# Patient Record
Sex: Male | Born: 1960
Health system: Southern US, Community
[De-identification: ages and names within clinical notes are randomized; demographics above are authoritative.]

## PROBLEM LIST (undated history)

## (undated) DIAGNOSIS — I10 Essential (primary) hypertension: Secondary | ICD-10-CM

## (undated) DIAGNOSIS — K610 Anal abscess: Secondary | ICD-10-CM

## (undated) DIAGNOSIS — Z8489 Family history of other specified conditions: Secondary | ICD-10-CM

## (undated) DIAGNOSIS — T883XXA Malignant hyperthermia due to anesthesia, initial encounter: Secondary | ICD-10-CM

---
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
## 2009-09-07 HISTORY — PX: CHOLECYSTECTOMY: SHX55

## 2010-06-10 ENCOUNTER — Inpatient Hospital Stay: Payer: Self-pay | Admitting: Surgery

## 2011-05-24 IMAGING — CT CT ABD-PELV W/ CM
1 of 2 series · 16 of 32 positions shown, 20 images · IV contrast (isovue)
Comparison: none

REASON FOR EXAM: (1) Pancreatitis; (2) same
COMMENTS:

PROCEDURE:     CT  - CT ABDOMEN / PELVIS  W  - June 10, 2010  [DATE]
RESULT:     Comparison:  Abdominal ultrasound 06/10/2010
TECHNIQUE: Multiple axial images of the abdomen and pelvis were performed
from the lung bases to the pubic symphysis, with p.o. contrast and with 100
mL of Isovue 370 intravenous contrast.

[Series 2: abd/pel · axial · 0.80mm/px · z∈[-1122,-612]mm · 16 of 186 slices shown, 20 images]
[im 8/186  soft-tissue]
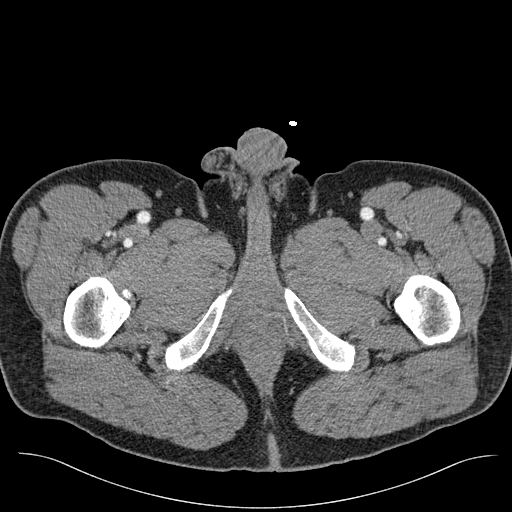
[im 8/186  bone]
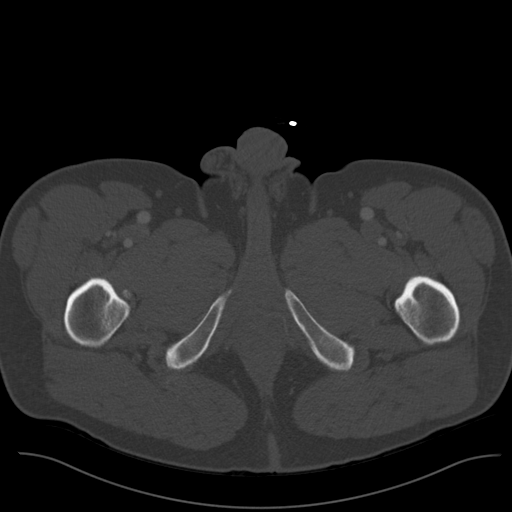
[im 24/186  soft-tissue]
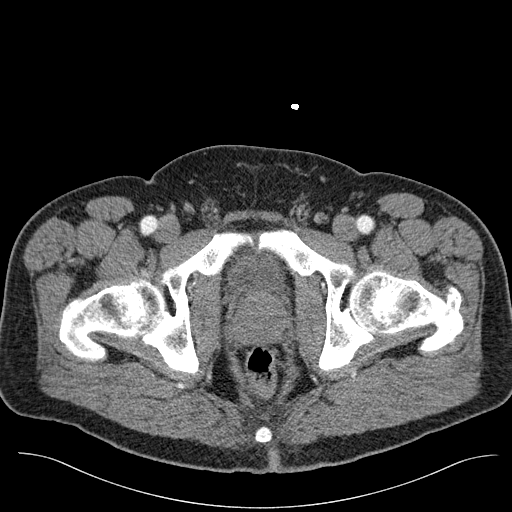
[im 39/186  soft-tissue]
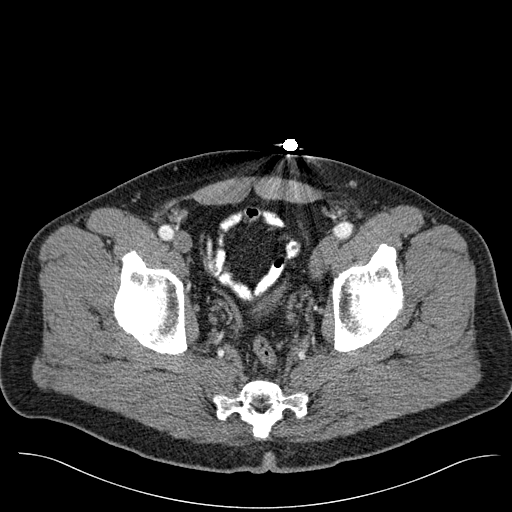
[im 47/186  soft-tissue]
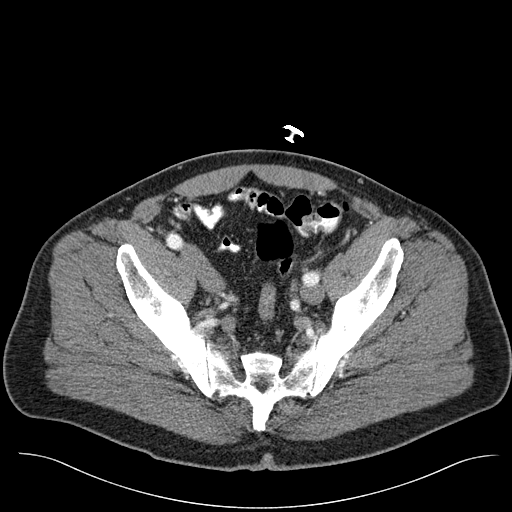
[im 62/186  soft-tissue]
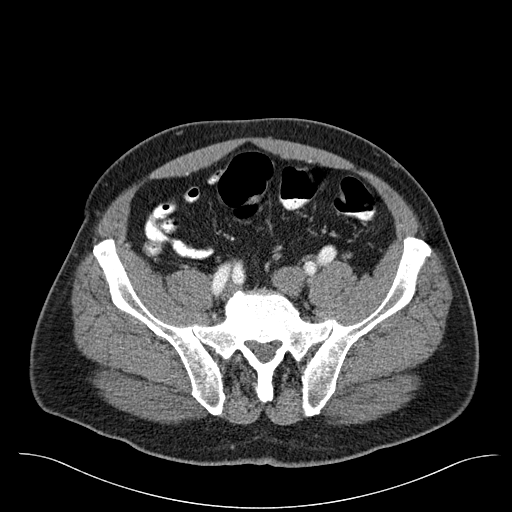
[im 78/186  soft-tissue]
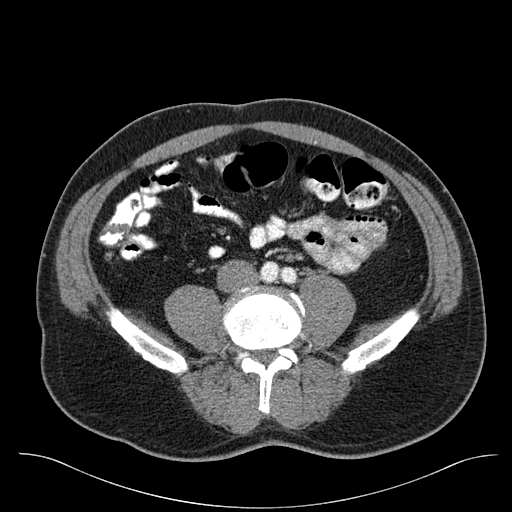
[im 85/186  soft-tissue]
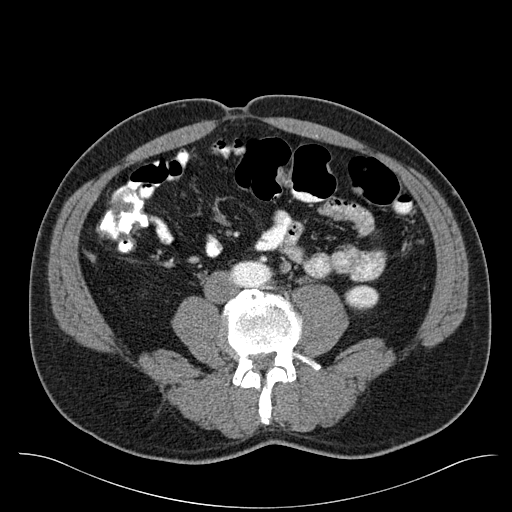
[im 101/186  soft-tissue]
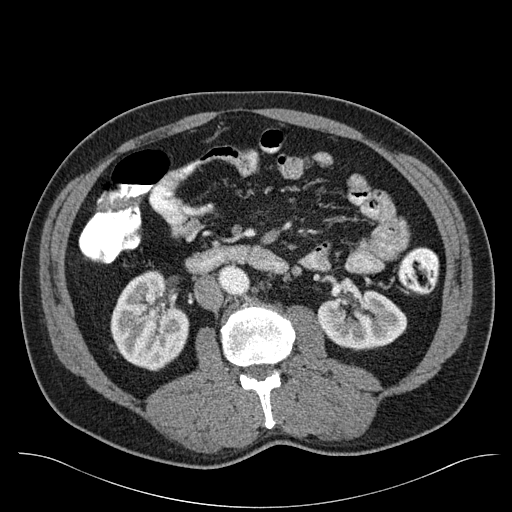
[im 108/186  soft-tissue]
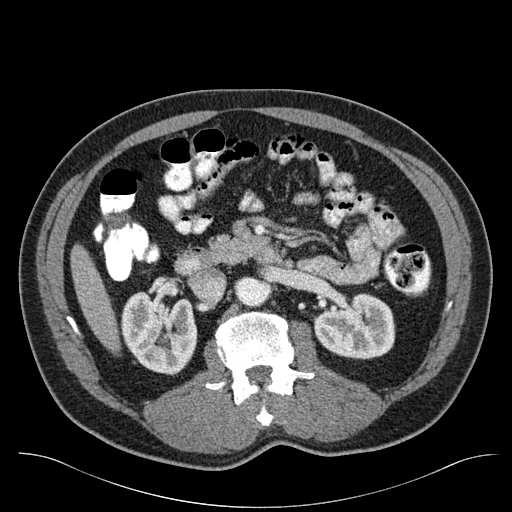
[im 108/186  bone]
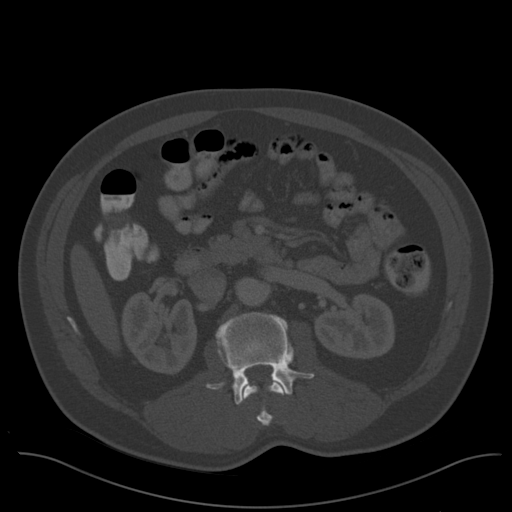
[im 124/186  soft-tissue]
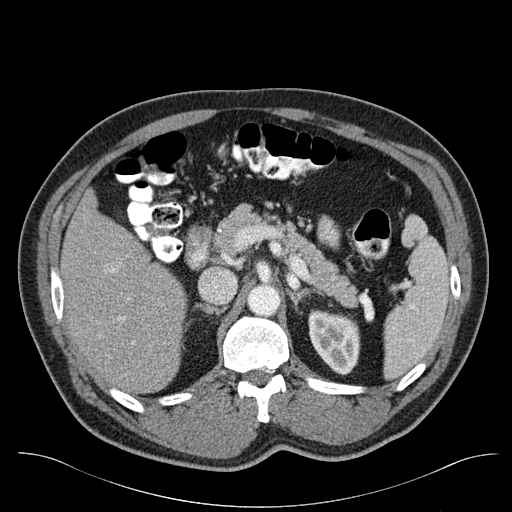
[im 139/186  soft-tissue]
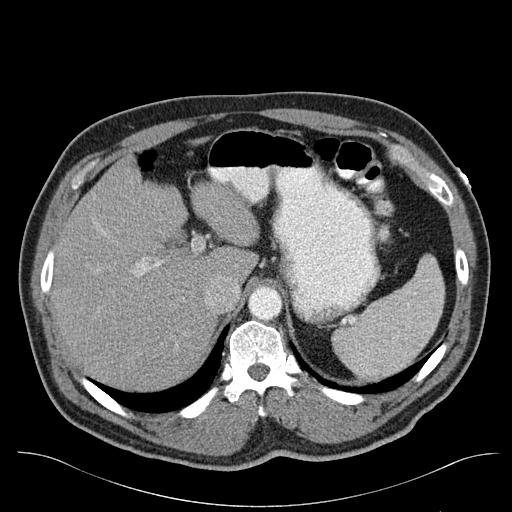
[im 147/186  soft-tissue]
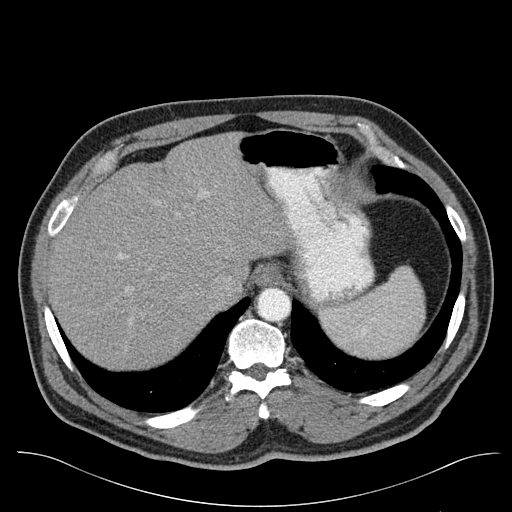
[im 155/186  lung]
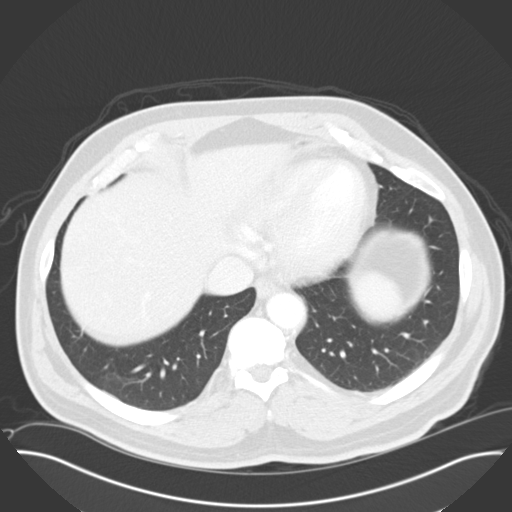
[im 162/186  soft-tissue]
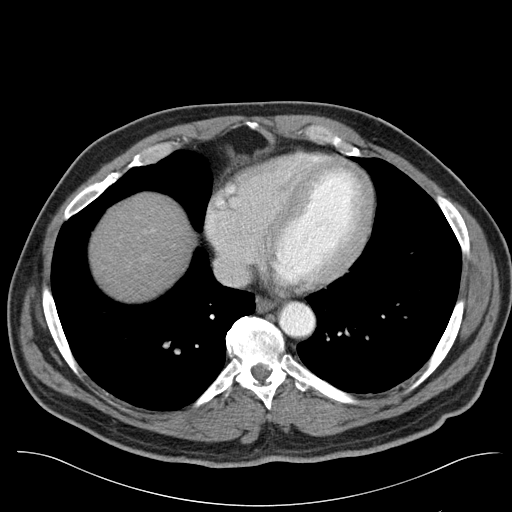
[im 162/186  lung]
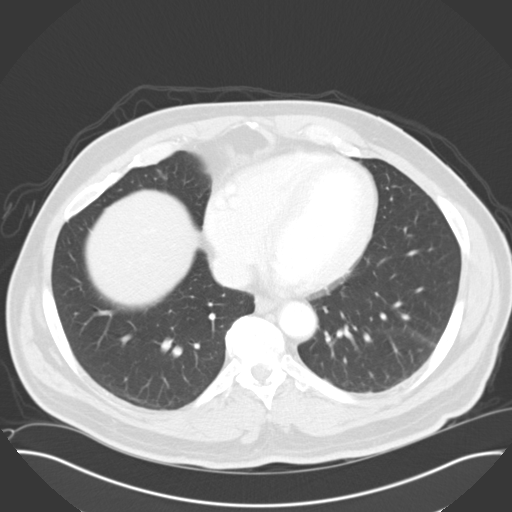
[im 170/186  lung]
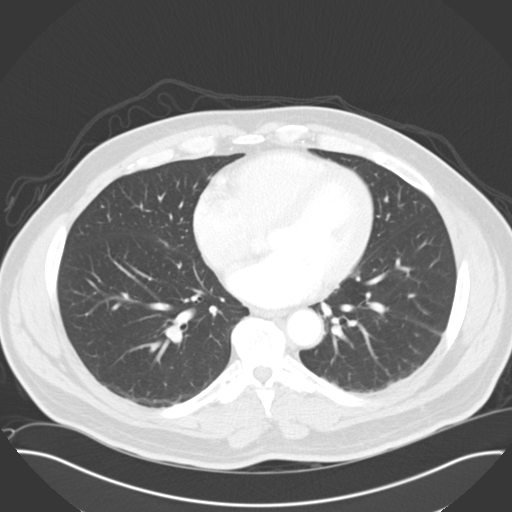
[im 178/186  soft-tissue]
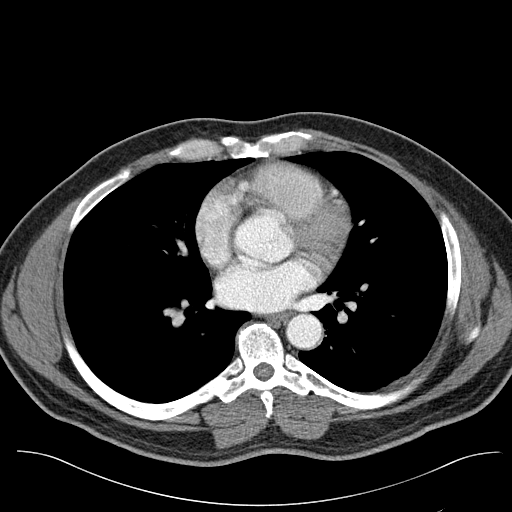
[im 178/186  lung]
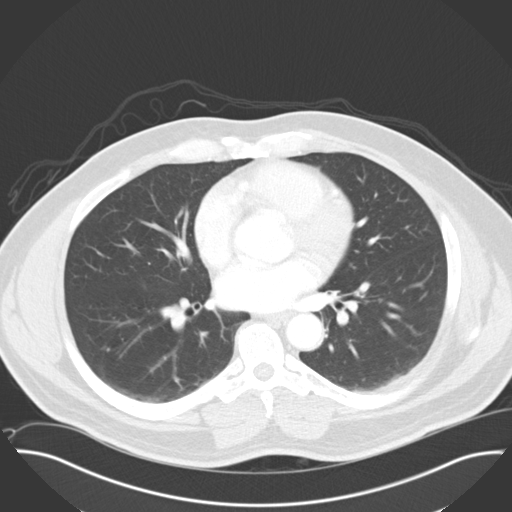

[16 of 32 positions shown; findings below may reference images not displayed]

FINDINGS: No focal liver lesion identified. There are multiple stones within the
gallbladder. The gallbladder wall appears minimally prominent. However, is
not thickened on the gallbladder ultrasound performed same day. The spleen,
adrenals, and pancreas are unremarkable. The kidneys enhance normally.

No retroperitoneal mesenteric lymphadenopathy. The small and large bowel are
normal in caliber. The appendix is normal.

No aggressive lytic or sclerotic osseous lesions identified.
IMPRESSION: 1. No CT evidence of acute pancreatitis.
2. Cholelithiasis.

## 2021-01-11 ENCOUNTER — Other Ambulatory Visit: Payer: Self-pay

## 2021-01-11 ENCOUNTER — Emergency Department
Admission: EM | Admit: 2021-01-11 | Discharge: 2021-01-11 | Disposition: A | Discharge status: Home / Self Care | Payer: BC Managed Care – PPO | Source: Home / Self Care | Attending: Emergency Medicine | Admitting: Emergency Medicine

## 2021-01-11 DIAGNOSIS — L0231 Cutaneous abscess of buttock: Secondary | ICD-10-CM | POA: Diagnosis not present

## 2021-01-11 DIAGNOSIS — E876 Hypokalemia: Secondary | ICD-10-CM

## 2021-01-11 DIAGNOSIS — I10 Essential (primary) hypertension: Secondary | ICD-10-CM | POA: Insufficient documentation

## 2021-01-11 DIAGNOSIS — M25421 Effusion, right elbow: Secondary | ICD-10-CM

## 2021-01-11 DIAGNOSIS — Y9389 Activity, other specified: Secondary | ICD-10-CM | POA: Insufficient documentation

## 2021-01-11 DIAGNOSIS — L03317 Cellulitis of buttock: Secondary | ICD-10-CM | POA: Insufficient documentation

## 2021-01-11 DIAGNOSIS — M7022 Olecranon bursitis, left elbow: Secondary | ICD-10-CM | POA: Insufficient documentation

## 2021-01-11 HISTORY — DX: Essential (primary) hypertension: I10

## 2021-01-11 LAB — CBC WITH DIFFERENTIAL/PLATELET
Abs Immature Granulocytes: 0.09 10*3/uL — ABNORMAL HIGH (ref 0.00–0.07)
Basophils Absolute: 0.1 10*3/uL (ref 0.0–0.1)
Basophils Relative: 1 %
Eosinophils Absolute: 0.2 10*3/uL (ref 0.0–0.5)
Eosinophils Relative: 2 %
HCT: 37.3 % — ABNORMAL LOW (ref 39.0–52.0)
Hemoglobin: 13 g/dL (ref 13.0–17.0)
Immature Granulocytes: 1 %
Lymphocytes Relative: 9 %
Lymphs Abs: 0.9 10*3/uL (ref 0.7–4.0)
MCH: 28.8 pg (ref 26.0–34.0)
MCHC: 34.9 g/dL (ref 30.0–36.0)
MCV: 82.5 fL (ref 80.0–100.0)
Monocytes Absolute: 1.3 10*3/uL — ABNORMAL HIGH (ref 0.1–1.0)
Monocytes Relative: 12 %
Neutro Abs: 8.3 10*3/uL — ABNORMAL HIGH (ref 1.7–7.7)
Neutrophils Relative %: 75 %
Platelets: 192 10*3/uL (ref 150–400)
RBC: 4.52 MIL/uL (ref 4.22–5.81)
RDW: 13.5 % (ref 11.5–15.5)
WBC: 10.9 10*3/uL — ABNORMAL HIGH (ref 4.0–10.5)
nRBC: 0 % (ref 0.0–0.2)

## 2021-01-11 LAB — BASIC METABOLIC PANEL
Anion gap: 11 (ref 5–15)
BUN: 17 mg/dL (ref 6–20)
CO2: 28 mmol/L (ref 22–32)
Calcium: 8.7 mg/dL — ABNORMAL LOW (ref 8.9–10.3)
Chloride: 94 mmol/L — ABNORMAL LOW (ref 98–111)
Creatinine, Ser: 1.11 mg/dL (ref 0.61–1.24)
GFR, Estimated: >60 mL/min (ref >60)
Glucose, Bld: 111 mg/dL — ABNORMAL HIGH (ref 70–99)
Potassium: 2.7 mmol/L — CL (ref 3.5–5.1)
Sodium: 133 mmol/L — ABNORMAL LOW (ref 135–145)

## 2021-01-11 MED ORDER — HYDROMORPHONE HCL 1 MG/ML IJ SOLN
1.0000 mg | Freq: Once | INTRAMUSCULAR | Status: AC
  Administered 2021-01-11: 1 mg via INTRAVENOUS
  Filled 2021-01-11: qty 1

## 2021-01-11 MED ORDER — CLINDAMYCIN PHOSPHATE 600 MG/50ML IV SOLN
600.0000 mg | Freq: Once | INTRAVENOUS | Status: AC
  Administered 2021-01-11: 600 mg via INTRAVENOUS
  Filled 2021-01-11: qty 50

## 2021-01-11 MED ORDER — POTASSIUM CHLORIDE CRYS ER 10 MEQ PO TBCR: 10.0000 meq | EXTENDED_RELEASE_TABLET | Freq: Two times a day (BID) | ORAL | 0 refills | Status: DC

## 2021-01-11 MED ORDER — CLINDAMYCIN HCL 150 MG PO CAPS: 150.0000 mg | ORAL_CAPSULE | Freq: Four times a day (QID) | ORAL | 0 refills | Status: DC

## 2021-01-11 MED ORDER — KETOROLAC TROMETHAMINE 30 MG/ML IJ SOLN
30.0000 mg | Freq: Once | INTRAMUSCULAR | Status: AC
  Administered 2021-01-11: 30 mg via INTRAVENOUS
  Filled 2021-01-11: qty 1

## 2021-01-11 MED ORDER — KETOROLAC TROMETHAMINE 10 MG PO TABS: 10.0000 mg | ORAL_TABLET | Freq: Four times a day (QID) | ORAL | 0 refills | Status: DC | PRN

## 2021-01-11 MED ORDER — MORPHINE SULFATE (PF) 4 MG/ML IV SOLN: 4.0000 mg | Freq: Once | INTRAVENOUS | Status: DC

## 2021-01-11 MED ORDER — OXYCODONE-ACETAMINOPHEN 7.5-325 MG PO TABS: 1.0000 | ORAL_TABLET | Freq: Four times a day (QID) | ORAL | 0 refills | Status: AC | PRN

## 2021-01-11 MED ORDER — LIDOCAINE-EPINEPHRINE 2 %-1:100000 IJ SOLN
20.0000 mL | Freq: Once | INTRAMUSCULAR | Status: AC
  Administered 2021-01-11: 20 mL
  Filled 2021-01-11: qty 1

## 2021-01-11 NOTE — ED Notes (Signed)
See triage note  Presents with possible cyst to tailbone area  Noticed area last Saturday  But area is worse today

## 2021-01-11 NOTE — ED Triage Notes (Signed)
Pt states about a week ago he noticed an abscess on his left buttock- pt states it has grown in size since then

## 2021-01-11 NOTE — Discharge Instructions (Addendum)
The abscess on your buttocks is non fluctuant.  Incision and drainage is not indicated at this time.  You received IV antibiotics in the ED today.  You will be switched to oral antibiotics, anti-inflammatories, and pain medications upon discharge.  Advised to call the surgical clinic listed on your discharge care instruction to schedule appointment for further evaluation and treatment.  Advised not to eat breakfast on the morning that you call in case they need you to come in for definitive treatment.  Your labs showed that your potassium level was low.  You were given a prescription for potassium to take twice a day until evaluation by your family doctor.  Be advised continue antibiotics until seen by surgical clinic.  Pain medication may cause drowsiness.  Do not drive operate machinery while taking this medication.

## 2021-01-11 NOTE — ED Provider Notes (Signed)
[facility] Emergency Department Provider Note   ____________________________________________   None    (approximate)  I have reviewed the triage vital signs and the nursing notes.   HISTORY  Chief Complaint Abscess    HPI Daniel Steele is a 60 y.o. male presents for an abscess on the left buttocks which has grown in size the past week.  Patient denies he has it mostly on right buttocks.  Patient also has swelling to the posterior right elbow.  Patient states edema to left elbow for approximately 2 weeks.  Denies fever associated complaint.  Rates his pain as a 7/10.  Described pain as "sore".  No palliative measures for complaint.      Past Medical History:  Diagnosis Date  . Hypertension     There are no problems to display for this patient.   Past Surgical History:  Procedure Laterality Date  . CHOLECYSTECTOMY      Prior to Admission medications   Medication Sig Start Date End Date Taking? Authorizing Provider  clindamycin (CLEOCIN) 150 MG capsule Take 1 capsule (150 mg total) by mouth 4 (four) times daily for 10 days. 01/11/21 01/21/21 Yes Joni Reining, PA-C  ketorolac (TORADOL) 10 MG tablet Take 1 tablet (10 mg total) by mouth every 6 (six) hours as needed. 01/11/21  Yes Joni Reining, PA-C  oxyCODONE-acetaminophen (PERCOCET) 7.5-325 MG tablet Take 1 tablet by mouth every 6 (six) hours as needed for up to 5 days for severe pain. 01/11/21 01/16/21 Yes Joni Reining, PA-C  potassium chloride (KLOR-CON) 10 MEQ tablet Take 1 tablet (10 mEq total) by mouth 2 (two) times daily. 01/11/21  Yes Joni Reining, PA-C    Allergies Patient has no known allergies.  No family history on file.  Social History Social History   Tobacco Use  . Smoking status: Never Smoker  . Smokeless tobacco: Never Used    Review of Systems Constitutional: No fever/chills Eyes: No visual changes. ENT: No sore throat. Cardiovascular: Denies chest  pain. Respiratory: Denies shortness of breath. Gastrointestinal: No abdominal pain.  No nausea, no vomiting.  No diarrhea.  No constipation. Genitourinary: Negative for dysuria. Musculoskeletal: Negative for back pain. Skin: Negative for rash.  Edema to posterior right elbow.  Edema and erythema to the left buttocks. Neurological: Negative for headaches, focal weakness or numbness. Endocrine:  Hypertension   ____________________________________________   PHYSICAL EXAM:  VITAL SIGNS: ED Triage Vitals  Enc Vitals Group     BP 01/11/21 1244 (!) 149/96     Pulse Rate 01/11/21 1244 88     Resp 01/11/21 1244 18     Temp 01/11/21 1244 99.8 F (37.7 C)     Temp Source 01/11/21 1244 Oral     SpO2 01/11/21 1244 98 %     Weight 01/11/21 1245 220 lb (99.8 kg)     Height 01/11/21 1245 5\' 11"  (1.803 m)     Head Circumference --      Peak Flow --      Pain Score 01/11/21 1245 7     Pain Loc --      Pain Edu? --      Excl. in GC? --     Constitutional: Alert and oriented. Well appearing and in no acute distress. Cardiovascular: Normal rate, regular rhythm. Grossly normal heart sounds.  Good peripheral circulation. Respiratory: Normal respiratory effort.  No retractions. Lungs CTAB. Gastrointestinal: Soft and nontender. No distention. No abdominal bruits. No CVA tenderness.  Genitourinary: Deferred Musculoskeletal: No lower extremity tenderness nor edema.  No joint effusions. Neurologic:  Normal speech and language. No gross focal neurologic deficits are appreciated. No gait instability. Skin: Edema to right posterior elbow.  Edema and erythema to left buttocks. Psychiatric: Mood and affect are normal. Speech and behavior are normal.  ____________________________________________   LABS (all labs ordered are listed, but only abnormal results are displayed)  Labs Reviewed  BASIC METABOLIC PANEL - Abnormal; Notable for the following components:      Result Value   Sodium 133 (*)     Potassium 2.7 (*)    Chloride 94 (*)    Glucose, Bld 111 (*)    Calcium 8.7 (*)    All other components within normal limits  CBC WITH DIFFERENTIAL/PLATELET - Abnormal; Notable for the following components:   WBC 10.9 (*)    HCT 37.3 (*)    Neutro Abs 8.3 (*)    Monocytes Absolute 1.3 (*)    Abs Immature Granulocytes 0.09 (*)    All other components within normal limits   ____________________________________________  EKG  _________________________________________  RADIOLOGY I, Joni Reining, personally viewed and evaluated these images (plain radiographs) as part of my medical decision making, as well as reviewing the written report by the radiologist.  ED MD interpretation:   Official radiology report(s): No results found.  ____________________________________________   PROCEDURES  Procedure(s) performed (including Critical Care):  Procedures   ____________________________________________   INITIAL IMPRESSION / ASSESSMENT AND PLAN / ED COURSE  As part of my medical decision making, I reviewed the following data within the electronic MEDICAL RECORD NUMBER         Patient presents with swelling to the posterior left elbow for probably 2 weeks.  Patient complaining physical exam consistent with olecranon bursitis.  See procedure note for aspiration.  Patient also has edema and erythema to the left buttocks.  Area is nonfluctuant and I&D is not indicated at this time.  Due to the extensive area of edema and erythema IV antibiotics was started using clindamycin.  Patient will switch to oral medication and advised to follow-up by calling surgical clinic in 2 days.  Return back to ED if condition worsens.  Patient labs showed that he was hypokalemic and a prescription for potassium was generated.  Patient advised establish care with a PCP as soon as possible.      ____________________________________________   FINAL CLINICAL IMPRESSION(S) / ED DIAGNOSES  Final diagnoses:   Effusion of right olecranon bursa  Cellulitis and abscess of buttock  Hypokalemia     ED Discharge Orders         Ordered    clindamycin (CLEOCIN) 150 MG capsule  4 times daily        01/11/21 1634    oxyCODONE-acetaminophen (PERCOCET) 7.5-325 MG tablet  Every 6 hours PRN        01/11/21 1634    ketorolac (TORADOL) 10 MG tablet  Every 6 hours PRN        01/11/21 1634    potassium chloride (KLOR-CON) 10 MEQ tablet  2 times daily        01/11/21 1644          *Please note:  Daniel Steele was evaluated in Emergency Department on 01/11/2021 for the symptoms described in the history of present illness. He was evaluated in the context of the global COVID-19 pandemic, which necessitated consideration that the patient might be at risk for infection with the SARS-CoV-2  virus that causes COVID-19. Institutional protocols and algorithms that pertain to the evaluation of patients at risk for COVID-19 are in a state of rapid change based on information released by regulatory bodies including the CDC and federal and state organizations. These policies and algorithms were followed during the patient's care in the ED.  Some ED evaluations and interventions may be delayed as a result of limited staffing during and the pandemic.*   Note:  This document was prepared using Dragon voice recognition software and may include unintentional dictation errors.    Joni Reining, PA-C 01/11/21 1647    Sharyn Creamer, MD 01/11/21 1710

## 2021-01-13 ENCOUNTER — Inpatient Hospital Stay: Payer: BC Managed Care – PPO

## 2021-01-13 ENCOUNTER — Encounter: Payer: Self-pay | Admitting: Surgery

## 2021-01-13 ENCOUNTER — Ambulatory Visit: Payer: BC Managed Care – PPO | Admitting: Surgery

## 2021-01-13 ENCOUNTER — Inpatient Hospital Stay
Admission: AD | Admit: 2021-01-13 | Discharge: 2021-01-15 | DRG: 603 | Disposition: A | Discharge status: Home / Self Care | Payer: BC Managed Care – PPO | Attending: Surgery | Admitting: Surgery

## 2021-01-13 ENCOUNTER — Inpatient Hospital Stay: Payer: BC Managed Care – PPO | Admitting: Anesthesiology

## 2021-01-13 ENCOUNTER — Telehealth: Payer: Self-pay

## 2021-01-13 ENCOUNTER — Encounter
Admission: AD | Disposition: A | Discharge status: Home / Self Care | Payer: Self-pay | Source: Home / Self Care | Attending: Surgery

## 2021-01-13 ENCOUNTER — Other Ambulatory Visit: Payer: Self-pay

## 2021-01-13 VITALS — BP 168/99 | HR 71 | Temp 98.0°F | Ht 71.0 in | Wt 231.8 lb

## 2021-01-13 DIAGNOSIS — M160 Bilateral primary osteoarthritis of hip: Secondary | ICD-10-CM | POA: Diagnosis not present

## 2021-01-13 DIAGNOSIS — K6139 Other ischiorectal abscess: Secondary | ICD-10-CM | POA: Diagnosis present

## 2021-01-13 DIAGNOSIS — K611 Rectal abscess: Secondary | ICD-10-CM

## 2021-01-13 DIAGNOSIS — Z20822 Contact with and (suspected) exposure to covid-19: Secondary | ICD-10-CM | POA: Diagnosis present

## 2021-01-13 DIAGNOSIS — N281 Cyst of kidney, acquired: Secondary | ICD-10-CM | POA: Diagnosis not present

## 2021-01-13 DIAGNOSIS — I10 Essential (primary) hypertension: Secondary | ICD-10-CM | POA: Diagnosis present

## 2021-01-13 DIAGNOSIS — E876 Hypokalemia: Secondary | ICD-10-CM | POA: Diagnosis not present

## 2021-01-13 DIAGNOSIS — B954 Other streptococcus as the cause of diseases classified elsewhere: Secondary | ICD-10-CM

## 2021-01-13 DIAGNOSIS — K610 Anal abscess: Secondary | ICD-10-CM | POA: Diagnosis present

## 2021-01-13 DIAGNOSIS — L0231 Cutaneous abscess of buttock: Secondary | ICD-10-CM | POA: Diagnosis not present

## 2021-01-13 HISTORY — PX: INCISION AND DRAINAGE ABSCESS: SHX5864

## 2021-01-13 HISTORY — DX: Family history of other specified conditions: Z84.89

## 2021-01-13 LAB — CBC WITH DIFFERENTIAL/PLATELET
Abs Immature Granulocytes: 0.19 10*3/uL — ABNORMAL HIGH (ref 0.00–0.07)
Basophils Absolute: 0.1 10*3/uL (ref 0.0–0.1)
Basophils Relative: 1 %
Eosinophils Absolute: 0.2 10*3/uL (ref 0.0–0.5)
Eosinophils Relative: 2 %
HCT: 39 % (ref 39.0–52.0)
Hemoglobin: 13.2 g/dL (ref 13.0–17.0)
Immature Granulocytes: 1 %
Lymphocytes Relative: 7 %
Lymphs Abs: 1 10*3/uL (ref 0.7–4.0)
MCH: 28.4 pg (ref 26.0–34.0)
MCHC: 33.8 g/dL (ref 30.0–36.0)
MCV: 83.9 fL (ref 80.0–100.0)
Monocytes Absolute: 0.9 10*3/uL (ref 0.1–1.0)
Monocytes Relative: 7 %
Neutro Abs: 11 10*3/uL — ABNORMAL HIGH (ref 1.7–7.7)
Neutrophils Relative %: 82 %
Platelets: 206 10*3/uL (ref 150–400)
RBC: 4.65 MIL/uL (ref 4.22–5.81)
RDW: 13.7 % (ref 11.5–15.5)
WBC: 13.4 10*3/uL — ABNORMAL HIGH (ref 4.0–10.5)
nRBC: 0 % (ref 0.0–0.2)

## 2021-01-13 LAB — COMPREHENSIVE METABOLIC PANEL
ALT: 69 U/L — ABNORMAL HIGH (ref 0–44)
AST: 83 U/L — ABNORMAL HIGH (ref 15–41)
Albumin: 3.1 g/dL — ABNORMAL LOW (ref 3.5–5.0)
Alkaline Phosphatase: 123 U/L (ref 38–126)
Anion gap: 13 (ref 5–15)
BUN: 10 mg/dL (ref 6–20)
CO2: 27 mmol/L (ref 22–32)
Calcium: 8.7 mg/dL — ABNORMAL LOW (ref 8.9–10.3)
Chloride: 92 mmol/L — ABNORMAL LOW (ref 98–111)
Creatinine, Ser: 0.94 mg/dL (ref 0.61–1.24)
GFR, Estimated: >60 mL/min (ref >60)
Glucose, Bld: 99 mg/dL (ref 70–99)
Potassium: 3 mmol/L — ABNORMAL LOW (ref 3.5–5.1)
Sodium: 132 mmol/L — ABNORMAL LOW (ref 135–145)
Total Bilirubin: 1.2 mg/dL (ref 0.3–1.2)
Total Protein: 7.2 g/dL (ref 6.5–8.1)

## 2021-01-13 LAB — RESP PANEL BY RT-PCR (FLU A&B, COVID) ARPGX2
Influenza A by PCR: NEGATIVE
Influenza B by PCR: NEGATIVE
SARS Coronavirus 2 by RT PCR: NEGATIVE

## 2021-01-13 SURGERY — INCISION AND DRAINAGE, ABSCESS
Anesthesia: General

## 2021-01-13 MED ORDER — CHLORHEXIDINE GLUCONATE 0.12 % MT SOLN: 15.0000 mL | Freq: Once | OROMUCOSAL | Status: AC

## 2021-01-13 MED ORDER — LIDOCAINE HCL URETHRAL/MUCOSAL 2 % EX GEL
CUTANEOUS | Status: AC
  Filled 2021-01-13: qty 10

## 2021-01-13 MED ORDER — ROCURONIUM BROMIDE 100 MG/10ML IV SOLN
INTRAVENOUS | Status: DC | PRN
  Administered 2021-01-13: 50 mg via INTRAVENOUS
  Administered 2021-01-13: 30 mg via INTRAVENOUS

## 2021-01-13 MED ORDER — SODIUM CHLORIDE 0.9 % IV SOLN: 2.0000 g | INTRAVENOUS | Status: DC

## 2021-01-13 MED ORDER — FENTANYL CITRATE (PF) 100 MCG/2ML IJ SOLN
INTRAMUSCULAR | Status: AC
  Filled 2021-01-13: qty 2

## 2021-01-13 MED ORDER — LIDOCAINE HCL (CARDIAC) PF 100 MG/5ML IV SOSY
PREFILLED_SYRINGE | INTRAVENOUS | Status: DC | PRN
  Administered 2021-01-13: 100 mg via INTRAVENOUS

## 2021-01-13 MED ORDER — PROPOFOL 10 MG/ML IV BOLUS
INTRAVENOUS | Status: AC
  Filled 2021-01-13: qty 20

## 2021-01-13 MED ORDER — FENTANYL CITRATE (PF) 100 MCG/2ML IJ SOLN
INTRAMUSCULAR | Status: DC | PRN
  Administered 2021-01-13 (×2): 50 ug via INTRAVENOUS

## 2021-01-13 MED ORDER — ACETAMINOPHEN 500 MG PO TABS
1000.0000 mg | ORAL_TABLET | Freq: Four times a day (QID) | ORAL | Status: DC
  Administered 2021-01-14 – 2021-01-15 (×6): 1000 mg via ORAL
  Filled 2021-01-13 (×7): qty 2

## 2021-01-13 MED ORDER — SODIUM CHLORIDE 0.9 % IV SOLN
INTRAVENOUS | Status: AC
  Filled 2021-01-13: qty 2

## 2021-01-13 MED ORDER — GABAPENTIN 300 MG PO CAPS: 300.0000 mg | ORAL_CAPSULE | ORAL | Status: AC

## 2021-01-13 MED ORDER — OXYCODONE HCL 5 MG PO TABS: 5.0000 mg | ORAL_TABLET | ORAL | Status: DC | PRN

## 2021-01-13 MED ORDER — ONDANSETRON HCL 4 MG/2ML IJ SOLN
4.0000 mg | Freq: Once | INTRAMUSCULAR | Status: DC | PRN
Start: 2021-01-13 — End: 2021-01-13

## 2021-01-13 MED ORDER — PROPOFOL 10 MG/ML IV BOLUS
INTRAVENOUS | Status: DC | PRN
  Administered 2021-01-13: 160 mg via INTRAVENOUS
  Administered 2021-01-13: 40 mg via INTRAVENOUS

## 2021-01-13 MED ORDER — BUPIVACAINE-EPINEPHRINE (PF) 0.5% -1:200000 IJ SOLN
INTRAMUSCULAR | Status: AC
  Filled 2021-01-13: qty 30

## 2021-01-13 MED ORDER — SODIUM CHLORIDE (PF) 0.9 % IJ SOLN
INTRAMUSCULAR | Status: AC
  Filled 2021-01-13: qty 10

## 2021-01-13 MED ORDER — LACTATED RINGERS IV SOLN: 125.0000 mL/h | INTRAVENOUS | Status: DC

## 2021-01-13 MED ORDER — CHLORHEXIDINE GLUCONATE CLOTH 2 % EX PADS
6.0000 | MEDICATED_PAD | Freq: Once | CUTANEOUS | Status: DC
Start: 2021-01-13 — End: 2021-01-13

## 2021-01-13 MED ORDER — KETOROLAC TROMETHAMINE 30 MG/ML IJ SOLN
30.0000 mg | Freq: Four times a day (QID) | INTRAMUSCULAR | Status: DC
  Administered 2021-01-14 – 2021-01-15 (×4): 30 mg via INTRAVENOUS
  Filled 2021-01-13 (×6): qty 1

## 2021-01-13 MED ORDER — DEXAMETHASONE SODIUM PHOSPHATE 10 MG/ML IJ SOLN
INTRAMUSCULAR | Status: DC | PRN
  Administered 2021-01-13: 10 mg via INTRAVENOUS

## 2021-01-13 MED ORDER — SUGAMMADEX SODIUM 500 MG/5ML IV SOLN
INTRAVENOUS | Status: DC | PRN
  Administered 2021-01-13: 200 mg via INTRAVENOUS

## 2021-01-13 MED ORDER — CHLORHEXIDINE GLUCONATE 0.12 % MT SOLN
OROMUCOSAL | Status: AC
  Administered 2021-01-13: 15 mL via OROMUCOSAL
  Filled 2021-01-13: qty 15

## 2021-01-13 MED ORDER — HYDROMORPHONE HCL 1 MG/ML IJ SOLN: 0.5000 mg | INTRAMUSCULAR | Status: DC | PRN

## 2021-01-13 MED ORDER — ONDANSETRON 4 MG PO TBDP: 4.0000 mg | ORAL_TABLET | Freq: Four times a day (QID) | ORAL | Status: DC | PRN

## 2021-01-13 MED ORDER — MIDAZOLAM HCL 2 MG/2ML IJ SOLN
INTRAMUSCULAR | Status: AC
  Filled 2021-01-13: qty 2

## 2021-01-13 MED ORDER — ONDANSETRON HCL 4 MG/2ML IJ SOLN
INTRAMUSCULAR | Status: DC | PRN
  Administered 2021-01-13: 4 mg via INTRAVENOUS

## 2021-01-13 MED ORDER — SODIUM CHLORIDE FLUSH 0.9 % IV SOLN
INTRAVENOUS | Status: AC
  Filled 2021-01-13: qty 10

## 2021-01-13 MED ORDER — ONDANSETRON HCL 4 MG/2ML IJ SOLN: 4.0000 mg | Freq: Four times a day (QID) | INTRAMUSCULAR | Status: DC | PRN

## 2021-01-13 MED ORDER — PROPOFOL 500 MG/50ML IV EMUL
INTRAVENOUS | Status: DC | PRN
  Administered 2021-01-13: 150 ug/kg/min via INTRAVENOUS

## 2021-01-13 MED ORDER — PROPOFOL 500 MG/50ML IV EMUL
INTRAVENOUS | Status: AC
  Filled 2021-01-13: qty 50

## 2021-01-13 MED ORDER — GELATIN ABSORBABLE 100 CM EX MISC
CUTANEOUS | Status: AC
  Filled 2021-01-13: qty 1

## 2021-01-13 MED ORDER — BUPIVACAINE LIPOSOME 1.3 % IJ SUSP
INTRAMUSCULAR | Status: AC
  Filled 2021-01-13: qty 20

## 2021-01-13 MED ORDER — KCL IN DEXTROSE-NACL 20-5-0.9 MEQ/L-%-% IV SOLN
INTRAVENOUS | Status: DC
Start: 2021-01-14 — End: 2021-01-15
  Filled 2021-01-13 (×6): qty 1000

## 2021-01-13 MED ORDER — CEFOTETAN DISODIUM 2 G IJ SOLR
2.0000 g | INTRAMUSCULAR | Status: AC
  Administered 2021-01-13: 2 g via INTRAVENOUS

## 2021-01-13 MED ORDER — ORAL CARE MOUTH RINSE: 15.0000 mL | Freq: Once | OROMUCOSAL | Status: AC

## 2021-01-13 MED ORDER — MIDAZOLAM HCL 2 MG/2ML IJ SOLN
INTRAMUSCULAR | Status: DC | PRN
  Administered 2021-01-13: 2 mg via INTRAVENOUS

## 2021-01-13 MED ORDER — IOHEXOL 300 MG/ML  SOLN
100.0000 mL | Freq: Once | INTRAMUSCULAR | Status: AC | PRN
  Administered 2021-01-13: 100 mL via INTRAVENOUS

## 2021-01-13 MED ORDER — ONDANSETRON HCL 4 MG/2ML IJ SOLN
INTRAMUSCULAR | Status: AC
  Filled 2021-01-13: qty 2

## 2021-01-13 MED ORDER — ACETAMINOPHEN 500 MG PO TABS
ORAL_TABLET | ORAL | Status: AC
  Administered 2021-01-13: 1000 mg via ORAL
  Filled 2021-01-13: qty 2

## 2021-01-13 MED ORDER — BUPIVACAINE LIPOSOME 1.3 % IJ SUSP
INTRAMUSCULAR | Status: DC | PRN
  Administered 2021-01-13: 20 mL

## 2021-01-13 MED ORDER — PANTOPRAZOLE SODIUM 40 MG IV SOLR
40.0000 mg | Freq: Every day | INTRAVENOUS | Status: DC
  Administered 2021-01-14 (×2): 40 mg via INTRAVENOUS
  Filled 2021-01-13 (×2): qty 40

## 2021-01-13 MED ORDER — BUPIVACAINE-EPINEPHRINE 0.5% -1:200000 IJ SOLN
INTRAMUSCULAR | Status: DC | PRN
  Administered 2021-01-13: 30 mL

## 2021-01-13 MED ORDER — BUPIVACAINE LIPOSOME 1.3 % IJ SUSP
20.0000 mL | Freq: Once | INTRAMUSCULAR | Status: DC
Start: 2021-01-13 — End: 2021-01-13

## 2021-01-13 MED ORDER — DEXAMETHASONE SODIUM PHOSPHATE 10 MG/ML IJ SOLN
INTRAMUSCULAR | Status: AC
  Filled 2021-01-13: qty 1

## 2021-01-13 MED ORDER — LACTATED RINGERS IV SOLN: INTRAVENOUS | Status: DC

## 2021-01-13 MED ORDER — POLYETHYLENE GLYCOL 3350 17 G PO PACK: 17.0000 g | PACK | Freq: Every day | ORAL | Status: DC | PRN

## 2021-01-13 MED ORDER — ACETAMINOPHEN 500 MG PO TABS
1000.0000 mg | ORAL_TABLET | ORAL | Status: AC
Start: 2021-01-14 — End: 2021-01-13

## 2021-01-13 MED ORDER — PIPERACILLIN-TAZOBACTAM 3.375 G IVPB
3.3750 g | Freq: Three times a day (TID) | INTRAVENOUS | Status: DC
  Administered 2021-01-14 – 2021-01-15 (×5): 3.375 g via INTRAVENOUS
  Filled 2021-01-13 (×5): qty 50

## 2021-01-13 MED ORDER — ENOXAPARIN SODIUM 40 MG/0.4ML IJ SOSY
40.0000 mg | PREFILLED_SYRINGE | INTRAMUSCULAR | Status: DC
  Administered 2021-01-14: 40 mg via SUBCUTANEOUS
  Filled 2021-01-13: qty 0.4

## 2021-01-13 MED ORDER — GABAPENTIN 300 MG PO CAPS
ORAL_CAPSULE | ORAL | Status: AC
  Administered 2021-01-13: 300 mg via ORAL
  Filled 2021-01-13: qty 1

## 2021-01-13 MED ORDER — FENTANYL CITRATE (PF) 100 MCG/2ML IJ SOLN: 25.0000 ug | INTRAMUSCULAR | Status: DC | PRN

## 2021-01-13 SURGICAL SUPPLY — 44 items
BLADE CLIPPER SURG (BLADE) ×2 IMPLANT
BRIEF STRETCH FOR OB PAD XXL (UNDERPADS AND DIAPERS) ×2 IMPLANT
CANISTER SUCT 1200ML W/VALVE (MISCELLANEOUS) IMPLANT
COVER WAND RF STERILE (DRAPES) ×2 IMPLANT
DRAIN PENROSE 1/4X12 LTX STRL (WOUND CARE) ×6 IMPLANT
DRAPE LAPAROTOMY 100X77 ABD (DRAPES) ×2 IMPLANT
DRAPE LAPAROTOMY 77X122 PED (DRAPES) IMPLANT
DRAPE PERI LITHO V/GYN (MISCELLANEOUS) IMPLANT
DRAPE UNDER BUTTOCK W/FLU (DRAPES) IMPLANT
DRSG GAUZE FLUFF 36X18 (GAUZE/BANDAGES/DRESSINGS) IMPLANT
DRSG PAD ABDOMINAL 8X10 ST (GAUZE/BANDAGES/DRESSINGS) IMPLANT
ELECT REM PT RETURN 9FT ADLT (ELECTROSURGICAL) ×2
ELECTRODE REM PT RTRN 9FT ADLT (ELECTROSURGICAL) ×1 IMPLANT
GAUZE SPONGE 4X4 12PLY STRL (GAUZE/BANDAGES/DRESSINGS) ×2 IMPLANT
GLOVE SURG SYN 7.0 (GLOVE) ×4 IMPLANT
GLOVE SURG SYN 7.5  E (GLOVE) ×2
GLOVE SURG SYN 7.5 E (GLOVE) ×2 IMPLANT
GOWN STRL REUS W/ TWL LRG LVL3 (GOWN DISPOSABLE) ×2 IMPLANT
GOWN STRL REUS W/TWL LRG LVL3 (GOWN DISPOSABLE) ×2
KIT TURNOVER KIT A (KITS) ×2 IMPLANT
LABEL OR SOLS (LABEL) IMPLANT
LIGASURE IMPACT 36 18CM CVD LR (INSTRUMENTS) IMPLANT
MANIFOLD NEPTUNE II (INSTRUMENTS) ×2 IMPLANT
NEEDLE HYPO 22GX1.5 SAFETY (NEEDLE) ×2 IMPLANT
NS IRRIG 1000ML POUR BTL (IV SOLUTION) ×2 IMPLANT
NS IRRIG 500ML POUR BTL (IV SOLUTION) ×2 IMPLANT
PACK BASIN MINOR ARMC (MISCELLANEOUS) ×2 IMPLANT
PAD ABD DERMACEA PRESS 5X9 (GAUZE/BANDAGES/DRESSINGS) ×2 IMPLANT
PAD OB MATERNITY 4.3X12.25 (PERSONAL CARE ITEMS) IMPLANT
PAD PREP 24X41 OB/GYN DISP (PERSONAL CARE ITEMS) IMPLANT
SOL PREP PVP 2OZ (MISCELLANEOUS) ×2
SOLUTION PREP PVP 2OZ (MISCELLANEOUS) ×1 IMPLANT
SPONGE LAP 18X18 RF (DISPOSABLE) ×2 IMPLANT
SURGILUBE 2OZ TUBE FLIPTOP (MISCELLANEOUS) ×2 IMPLANT
SUT SILK 0 (SUTURE) ×1
SUT SILK 0 30XBRD TIE 6 (SUTURE) ×1 IMPLANT
SUT SILK 2 0 (SUTURE)
SUT SILK 2-0 18XBRD TIE 12 (SUTURE) IMPLANT
SUT VIC AB 2-0 SH 27 (SUTURE)
SUT VIC AB 2-0 SH 27XBRD (SUTURE) IMPLANT
SWAB CULTURE AMIES ANAERIB BLU (MISCELLANEOUS) ×2 IMPLANT
SYR 10ML LL (SYRINGE) ×2 IMPLANT
SYR BULB IRRIG 60ML STRL (SYRINGE) ×2 IMPLANT
TOWEL OR 17X26 4PK STRL BLUE (TOWEL DISPOSABLE) ×2 IMPLANT

## 2021-01-13 NOTE — Anesthesia Postprocedure Evaluation (Signed)
Anesthesia Post Note  Patient: Daniel Steele  Procedure(s) Performed: INCISION AND DRAINAGE ABSCESS perirectal (N/A )  Patient location during evaluation: PACU Anesthesia Type: General Level of consciousness: awake and alert Pain management: pain level controlled Vital Signs Assessment: post-procedure vital signs reviewed and stable Respiratory status: spontaneous breathing and respiratory function stable Cardiovascular status: stable Anesthetic complications: no   Encounter Complications  Complication Outcome Phase Comment  Malignant hyperthermia  Intraprocedure family hx     Last Vitals:  Vitals:   01/13/21 2310 01/13/21 2315  BP: 132/79   Pulse: 65 (!) 59  Resp: (!) 22 18  Temp:  36.9 C  SpO2: 95% 96%    Last Pain:  Vitals:   01/13/21 2255  PainSc: 0-No pain                 [name] K

## 2021-01-13 NOTE — Consult Note (Signed)
Pharmacy Antibiotic Note  Daniel Steele is a 60 y.o. male admitted on 01/13/2021 with left buttocks/perianal abscess. Presented to ED on 5/7 and was discharged with po clindamycin. Area has since grown in size. Pharmacy has been consulted for Zosyn dosing. 5/9: to OR for I&D  Plan: Zosyn 3.375g IV q8h (4 hour infusion).     Temp (24hrs), Avg:98 F (36.7 C), Min:97.9 F (36.6 C), Max:98 F (36.7 C)  Recent Labs  Lab 01/11/21 1345 01/13/21 1339  WBC 10.9* 13.4*  CREATININE 1.11 0.94    Estimated Creatinine Clearance: 105.7 mL/min (by C-G formula based on SCr of 0.94 mg/dL).    No Known Allergies  Antimicrobials this admission: 5/9 zosyn >>   Dose adjustments this admission: n/a  Microbiology results: 5/9: wound culture (I&D): pending   Thank you for allowing pharmacy to be a part of this patient's care.  Sharen Hones, PharmD, BCPS Clinical Pharmacist  01/13/2021 9:46 PM

## 2021-01-13 NOTE — Op Note (Signed)
  Procedure Date:  01/13/2021  Pre-operative Diagnosis:  Left gluteal and ischiorectal abscess  Post-operative Diagnosis:  Left gluteal and ischiorectal abscess  Procedure:  Incision and Drainage of left gluteal and ischiorectal abscess  Surgeon:  Howie Ill, MD  Anesthesia:  General endotracheal  Estimated Blood Loss:  30 ml  Specimens:  Culture swab  Complications:  None  Findings:  The patient had a large left gluteal abscess extending deep to the posterior perirectal tissue, and about to cross towards the right perirectal area.  Three penrose drains were placed around 4 incisions for appropriate drainage.  Indications for Procedure:  This is a 60 y.o. male with diagnosis of a left gluteal and perirectal abscess, requiring drainage procedure.  The risks of bleeding, abscess or infection, injury to surrounding structures, and need for further procedures were all discussed with the patient and was willing to proceed.  Description of Procedure: The patient was correctly identified in the preoperative area and brought into the operating room.  The patient was placed supine with VTE prophylaxis in place.  Appropriate time-outs were performed.  Anesthesia was induced and the patient was intubated.  Appropriate antibiotics were infused.  The patient was placed in prone position.  The patient's perianal area was prepped and draped in usual sterile fashion.  A 2 cm incision was made over the area of most fluctuance, lateral to the anal verge, revealing purulent fluid.  This fluid was swabbed for culture and sent to micro.  About 200 ml of purulent fluid was suctioned out.  Small Kelly forceps were used to dissect around the abscess tissue to open any remaining pockets of purulent fluid.  Kelly forceps were used to evaluate the size of the cavity.  This was extending anteriorly towards the base of the scrotum on the left side, posteriorly towards the upper buttocks / low back, and just about  to cross midline posterior to the anal canal.  Counter incisions were made in these three locations to help with drainage.  After drainage was completed, the cavity was irrigated and cleaned with 1.5 L of saline through all incisions.  50 ml of Exparel solution mixed with 0.5% bupivacaine with epi was infiltrated over all the incisions.  Three penrose drains were looped between the 4 incisions and tied with 0 Silk ties.  Hemostasis was good with cautery, but there was some oozing from the deeper portion of the abscess cavity.  This was controlled with manual pressure.  The area was then cleaned, dried, and dressed with 4x4 gauze, ABD pads, and mesh underwear.  The patient was then placed in supine position, emerged from anesthesia, extubated, and brought to the recovery room for further management.  The patient tolerated the procedure well and all counts were correct at the end of the case.   Howie Ill, MD

## 2021-01-13 NOTE — Progress Notes (Signed)
Dr Henrene Hawking called and notified of family history of malignant hyperthermia to father and cousin of patient.

## 2021-01-13 NOTE — Anesthesia Procedure Notes (Signed)
Procedure Name: Intubation Date/Time: 01/13/2021 9:25 PM Performed by: Irving Burton, CRNA Pre-anesthesia Checklist: Patient identified, Emergency Drugs available, Suction available and Patient being monitored Patient Re-evaluated:Patient Re-evaluated prior to induction Oxygen Delivery Method: Circle system utilized Preoxygenation: Pre-oxygenation with 100% oxygen Induction Type: IV induction Ventilation: Mask ventilation without difficulty Laryngoscope Size: McGraph and 4 Grade View: Grade I Tube type: Oral Tube size: 7.5 mm Number of attempts: 1 Airway Equipment and Method: Stylet and Video-laryngoscopy Placement Confirmation: ETT inserted through vocal cords under direct vision,  positive ETCO2 and breath sounds checked- equal and bilateral Secured at: 22 cm Tube secured with: Tape Dental Injury: Teeth and Oropharynx as per pre-operative assessment

## 2021-01-13 NOTE — Progress Notes (Signed)
01/13/2021  Reason for Visit:  Left buttocks / perianal abscess  History of Present Illness: Daniel Steele is a 60 y.o. male presenting to clinic for evaluation of left buttocks/perianal abscess.  He presented initially to the ER on 5/7 with an area on his left medial buttocks which was growing in size and getting more tender.  He reports that he has a history of this happening about 3 prior times in his life, and all of which have resolved on their own without any procedures or drainage or antibiotics.  However, this time around, the area was not resolving after starting a few days prior to ED visit.  On exam, he had an area of edema/induration, but no fluctuance, so no I&D was done.  He had a mildly elevated WBC of 10.9 and also was found to have low K at 2.7.  He was given IV clindamycin and po clindamycin for home as well as po potassium.    He called this morning as part of his instructions for follow up, and reports that the area is growing in size.  He has been taking his antibiotic as prescribed.  He was also advised to be fasting this morning for possible surgery.  He reports that now the area is larger, more tender, and harder.  Denies any drainage yet but feels like something could burst.  Reports tenderness, but denies fevers/chills, chest pain, shortness of breath, nausea, or vomiting.  Past Medical History: Past Medical History:  Diagnosis Date  . Hypertension      Past Surgical History: Past Surgical History:  Procedure Laterality Date  . arm surgery    . CHOLECYSTECTOMY      Home Medications: Prior to Admission medications   Medication Sig Start Date End Date Taking? Authorizing Provider  clindamycin (CLEOCIN) 150 MG capsule Take 1 capsule (150 mg total) by mouth 4 (four) times daily for 10 days. 01/11/21 01/21/21 Yes Joni Reining, PA-C  ketorolac (TORADOL) 10 MG tablet Take 1 tablet (10 mg total) by mouth every 6 (six) hours as needed. 01/11/21  Yes Joni Reining,  PA-C  oxyCODONE-acetaminophen (PERCOCET) 7.5-325 MG tablet Take 1 tablet by mouth every 6 (six) hours as needed for up to 5 days for severe pain. 01/11/21 01/16/21 Yes Joni Reining, PA-C  potassium chloride (KLOR-CON) 10 MEQ tablet Take 1 tablet (10 mEq total) by mouth 2 (two) times daily. 01/11/21  Yes Joni Reining, PA-C    Allergies: No Known Allergies  Social History:  reports that he has never smoked. He has never used smokeless tobacco. He reports that he does not drink alcohol and does not use drugs.   Family History: Family History  Problem Relation Age of Onset  . Cancer Father     Review of Systems: Review of Systems  Constitutional: Negative for chills and fever.  HENT: Negative for hearing loss.   Respiratory: Negative for shortness of breath.   Cardiovascular: Negative for chest pain.  Gastrointestinal: Negative for abdominal pain, constipation, diarrhea, nausea and vomiting.  Genitourinary: Negative for dysuria.  Musculoskeletal: Negative for myalgias.  Skin:       Induration/swelling of medial left buttocks with erythema.  Neurological: Negative for dizziness.  Psychiatric/Behavioral: Negative for depression.    Physical Exam BP (!) 168/99   Pulse 71   Temp 98 F (36.7 C) (Oral)   Ht 5\' 11"  (1.803 m)   Wt 231 lb 12.8 oz (105.1 kg)   SpO2 95%   BMI  32.33 kg/m  CONSTITUTIONAL: No acute distress HEENT:  Normocephalic, atraumatic, extraocular motion intact. NECK: Trachea is midline, and there is no jugular venous distension.  RESPIRATORY:  Lungs are clear, and breath sounds are equal bilaterally. Normal respiratory effort without pathologic use of accessory muscles. CARDIOVASCULAR: Heart is regular without murmurs, gallops, or rubs. GI: The abdomen is soft, non-distended, non-tender.  MUSCULOSKELETAL:  Normal muscle strength and tone in all four extremities.  No peripheral edema or cyanosis. SKIN: The patient has significant induration, blanching erythema  along the medial aspect of the left buttocks, extending posteriorly to the superior buttocks, anteriorly to the base of the scrotum, laterally to the mid buttocks, and medially to the anal verge.  There is possible area of fluctuance medially.  NEUROLOGIC:  Motor and sensation is grossly normal.  Cranial nerves are grossly intact. PSYCH:  Alert and oriented to person, place and time. Affect is normal.  Laboratory Analysis: Labs from 01/11/21: Na 133, K 2.7, Cl 94, CO2 28, BUN 17, Cr 1.11.  WBC 10.9, Hgb 13, Hct 37.3, Plt 192.  Imaging: None  Assessment and Plan: This is a 60 y.o. male with enlarging and expanding left buttocks/perianal abscess.   --Discussed with the patient that given the size/extent of this abscess with induration/edema, would be too difficult to do this at bedside in the office.  I feel that this would be better dealt with in the OR.  Furthermore, I think he would need to be admitted in order to get IV antibiotics after I&D is done in order to continue with the source control and induration. --Discussed with him admission to the hospital, getting labwork done as well as CT scan of his pelvis with contrast to evaluate the extent of his abscess for OR planning.  Discussed with him the surgery at length, that likely would be consisting of multiple small incisions and penrose drains rather than one long cut, would keep him NPO for now, get CT scan and labs, and after surgery, keep in hospital for IV antibiotics to help with the infection.  He's in agreement and willing to proceed.  Reviewed with him the surgical risks of bleeding, infection, injury to surrounding structures, and he's willing to proceed. --Will send patient to [facility] for COVID-19 testing and above workup in preparation for surgery this afternoon.  Face-to-face time spent with the patient and care providers was 60 minutes, with more than 50% of the time spent counseling, educating, and coordinating care of the patient.      Howie Ill, MD [facility]

## 2021-01-13 NOTE — Transfer of Care (Signed)
Immediate Anesthesia Transfer of Care Note  Patient: Daniel Steele  Procedure(s) Performed: INCISION AND DRAINAGE ABSCESS perirectal (N/A )  Patient Location: PACU  Anesthesia Type:General  Level of Consciousness: awake  Airway & Oxygen Therapy: Patient connected to face mask oxygen  Post-op Assessment: Post -op Vital signs reviewed and stable  Post vital signs: stable  Last Vitals:  Vitals Value Taken Time  BP 136/78 01/13/21 2242  Temp    Pulse 77 01/13/21 2243  Resp 25 01/13/21 2243  SpO2 97 % 01/13/21 2243  Vitals shown include unvalidated device data.  Last Pain: There were no vitals filed for this visit.       Complications:  Encounter Complications  Complication Outcome Phase Comment  Malignant hyperthermia  Intraprocedure family hx

## 2021-01-13 NOTE — Anesthesia Preprocedure Evaluation (Addendum)
Anesthesia Evaluation  Patient identified by MRN, date of birth, ID band Patient awake    Reviewed: Allergy & Precautions, NPO status , Patient's Chart, lab work & pertinent test results  History of Anesthesia Complications (+) Family history of anesthesia reactionNegative for: history of anesthetic complications (Father and cousin with hx of MH)  Airway Mallampati: II       Dental  (+) Upper Dentures, Poor Dentition, Chipped, Missing   Pulmonary neg sleep apnea, neg COPD, Not current smoker,           Cardiovascular hypertension (Pr off meds now), (-) Past MI and (-) CHF (-) dysrhythmias (-) Valvular Problems/Murmurs     Neuro/Psych neg Seizures    GI/Hepatic Neg liver ROS, neg GERD  ,  Endo/Other  neg diabetes  Renal/GU negative Renal ROS     Musculoskeletal   Abdominal   Peds  Hematology   Anesthesia Other Findings   Reproductive/Obstetrics                           Anesthesia Physical Anesthesia Plan  ASA: II and emergent  Anesthesia Plan: General   Post-op Pain Management:    Induction: Intravenous  PONV Risk Score and Plan: 2 and Ondansetron and Dexamethasone  Airway Management Planned: Oral ETT  Additional Equipment:   Intra-op Plan:   Post-operative Plan:   Informed Consent: I have reviewed the patients History and Physical, chart, labs and discussed the procedure including the risks, benefits and alternatives for the proposed anesthesia with the patient or authorized representative who has indicated his/her understanding and acceptance.       Plan Discussed with:   Anesthesia Plan Comments:         Anesthesia Quick Evaluation

## 2021-01-13 NOTE — H&P (Signed)
01/13/2021  Reason for Visit:  Left buttocks / perianal abscess  History of Present Illness: Daniel Steele is a 60 y.o. male presenting to clinic for evaluation of left buttocks/perianal abscess.  He presented initially to the ER on 5/7 with an area on his left medial buttocks which was growing in size and getting more tender.  He reports that he has a history of this happening about 3 prior times in his life, and all of which have resolved on their own without any procedures or drainage or antibiotics.  However, this time around, the area was not resolving after starting a few days prior to ED visit.  On exam, he had an area of edema/induration, but no fluctuance, so no I&D was done.  He had a mildly elevated WBC of 10.9 and also was found to have low K at 2.7.  He was given IV clindamycin and po clindamycin for home as well as po potassium.    He called this morning as part of his instructions for follow up, and reports that the area is growing in size.  He has been taking his antibiotic as prescribed.  He was also advised to be fasting this morning for possible surgery.  He reports that now the area is larger, more tender, and harder.  Denies any drainage yet but feels like something could burst.  Reports tenderness, but denies fevers/chills, chest pain, shortness of breath, nausea, or vomiting.  Past Medical History:     Past Medical History:  Diagnosis Date  . Hypertension      Past Surgical History:      Past Surgical History:  Procedure Laterality Date  . arm surgery    . CHOLECYSTECTOMY      Home Medications:        Prior to Admission medications   Medication Sig Start Date End Date Taking? Authorizing Provider  clindamycin (CLEOCIN) 150 MG capsule Take 1 capsule (150 mg total) by mouth 4 (four) times daily for 10 days. 01/11/21 01/21/21 Yes Joni Reining, PA-C  ketorolac (TORADOL) 10 MG tablet Take 1 tablet (10 mg total) by mouth every 6 (six) hours as needed.  01/11/21  Yes Joni Reining, PA-C  oxyCODONE-acetaminophen (PERCOCET) 7.5-325 MG tablet Take 1 tablet by mouth every 6 (six) hours as needed for up to 5 days for severe pain. 01/11/21 01/16/21 Yes Joni Reining, PA-C  potassium chloride (KLOR-CON) 10 MEQ tablet Take 1 tablet (10 mEq total) by mouth 2 (two) times daily. 01/11/21  Yes Joni Reining, PA-C    Allergies: No Known Allergies  Social History:  reports that he has never smoked. He has never used smokeless tobacco. He reports that he does not drink alcohol and does not use drugs.   Family History:      Family History  Problem Relation Age of Onset  . Cancer Father     Review of Systems: Review of Systems  Constitutional: Negative for chills and fever.  HENT: Negative for hearing loss.   Respiratory: Negative for shortness of breath.   Cardiovascular: Negative for chest pain.  Gastrointestinal: Negative for abdominal pain, constipation, diarrhea, nausea and vomiting.  Genitourinary: Negative for dysuria.  Musculoskeletal: Negative for myalgias.  Skin:       Induration/swelling of medial left buttocks with erythema.  Neurological: Negative for dizziness.  Psychiatric/Behavioral: Negative for depression.    Physical Exam BP (!) 168/99   Pulse 71   Temp 98 F (36.7 C) (Oral)  Ht 5\' 11"  (1.803 m)   Wt 231 lb 12.8 oz (105.1 kg)   SpO2 95%   BMI 32.33 kg/m  CONSTITUTIONAL: No acute distress HEENT:  Normocephalic, atraumatic, extraocular motion intact. NECK: Trachea is midline, and there is no jugular venous distension.  RESPIRATORY:  Lungs are clear, and breath sounds are equal bilaterally. Normal respiratory effort without pathologic use of accessory muscles. CARDIOVASCULAR: Heart is regular without murmurs, gallops, or rubs. GI: The abdomen is soft, non-distended, non-tender.  MUSCULOSKELETAL:  Normal muscle strength and tone in all four extremities.  No peripheral edema or cyanosis. SKIN: The patient  has significant induration, blanching erythema along the medial aspect of the left buttocks, extending posteriorly to the superior buttocks, anteriorly to the base of the scrotum, laterally to the mid buttocks, and medially to the anal verge.  There is possible area of fluctuance medially.  NEUROLOGIC:  Motor and sensation is grossly normal.  Cranial nerves are grossly intact. PSYCH:  Alert and oriented to person, place and time. Affect is normal.  Laboratory Analysis: Labs from 01/11/21: Na 133, K 2.7, Cl 94, CO2 28, BUN 17, Cr 1.11.  WBC 10.9, Hgb 13, Hct 37.3, Plt 192.  Imaging: None  Assessment and Plan: This is a 60 y.o. male with enlarging and expanding left buttocks/perianal abscess.   --Discussed with the patient that given the size/extent of this abscess with induration/edema, would be too difficult to do this at bedside in the office.  I feel that this would be better dealt with in the OR.  Furthermore, I think he would need to be admitted in order to get IV antibiotics after I&D is done in order to continue with the source control and induration. --Discussed with him admission to the hospital, getting labwork done as well as CT scan of his pelvis with contrast to evaluate the extent of his abscess for OR planning.  Discussed with him the surgery at length, that likely would be consisting of multiple small incisions and penrose drains rather than one long cut, would keep him NPO for now, get CT scan and labs, and after surgery, keep in hospital for IV antibiotics to help with the infection.  He's in agreement and willing to proceed.  Reviewed with him the surgical risks of bleeding, infection, injury to surrounding structures, and he's willing to proceed.   41, MD [facility]

## 2021-01-13 NOTE — Patient Instructions (Addendum)
Please go to pre-admit at the [facility].

## 2021-01-13 NOTE — Telephone Encounter (Signed)
Patient direct admit from office for surgery this afternoon for rectal abscess.

## 2021-01-14 ENCOUNTER — Encounter: Payer: Self-pay | Admitting: Surgery

## 2021-01-14 ENCOUNTER — Other Ambulatory Visit: Payer: Self-pay

## 2021-01-14 LAB — BASIC METABOLIC PANEL
Anion gap: 9 (ref 5–15)
BUN: 9 mg/dL (ref 6–20)
CO2: 31 mmol/L (ref 22–32)
Calcium: 8.8 mg/dL — ABNORMAL LOW (ref 8.9–10.3)
Chloride: 97 mmol/L — ABNORMAL LOW (ref 98–111)
Creatinine, Ser: 1.14 mg/dL (ref 0.61–1.24)
GFR, Estimated: >60 mL/min (ref >60)
Glucose, Bld: 196 mg/dL — ABNORMAL HIGH (ref 70–99)
Potassium: 2.9 mmol/L — ABNORMAL LOW (ref 3.5–5.1)
Sodium: 137 mmol/L (ref 135–145)

## 2021-01-14 LAB — CBC
HCT: 36.8 % — ABNORMAL LOW (ref 39.0–52.0)
Hemoglobin: 12.5 g/dL — ABNORMAL LOW (ref 13.0–17.0)
MCH: 28.3 pg (ref 26.0–34.0)
MCHC: 34 g/dL (ref 30.0–36.0)
MCV: 83.4 fL (ref 80.0–100.0)
Platelets: 233 10*3/uL (ref 150–400)
RBC: 4.41 MIL/uL (ref 4.22–5.81)
RDW: 13.8 % (ref 11.5–15.5)
WBC: 14.7 10*3/uL — ABNORMAL HIGH (ref 4.0–10.5)
nRBC: 0 % (ref 0.0–0.2)

## 2021-01-14 LAB — MAGNESIUM: Magnesium: 2.3 mg/dL (ref 1.7–2.4)

## 2021-01-14 LAB — HIV ANTIBODY (ROUTINE TESTING W REFLEX): HIV Screen 4th Generation wRfx: NONREACTIVE

## 2021-01-14 MED ORDER — POTASSIUM CHLORIDE CRYS ER 20 MEQ PO TBCR
40.0000 meq | EXTENDED_RELEASE_TABLET | ORAL | Status: AC
  Administered 2021-01-14 (×2): 40 meq via ORAL
  Filled 2021-01-14 (×2): qty 2

## 2021-01-14 NOTE — Progress Notes (Signed)
[facility] SURGICAL PROGRESS NOTE  Hospital Day(s): 1.   Post op day(s): 1 Day Post-Op.   Interval History:  Patient seen and examined No acute events or new complaints overnight.  Patient reports his pain has significantly improved from pre-operatively No fever, chills He did need dressing changed overnight secondary to significant oozing He did have a slight bump in his leukocytosis to 14.7K; likely reactive from OR He is maintaining his renal function; sCr - 1.14; UO - 950 ccs + unmeasured Hypokalemia to 2.9; no other significant electrolyte derangement  Cx from OR are pending; He continues on zosyn  Vital signs in last 24 hours: [min-max] current  Temp:  [97.9 F (36.6 C)-98.7 F (37.1 C)] 98 F (36.7 C) (05/10 0401) Pulse Rate:  [56-77] 56 (05/10 0401) Resp:  [12-29] 18 (05/10 0401) BP: (115-168)/(78-99) 134/86 (05/10 0401) SpO2:  [93 %-100 %] 95 % (05/10 0401) Weight:  [105.1 kg] 105.1 kg (05/09 1122)             Intake/Output last 2 shifts:  05/09 0701 - 05/10 0700 In: 757 [P.O.:570; I.V.:52.9; IV Piggyback:134.1] Out: 980 [Urine:950; Blood:30]   Physical Exam:  Constitutional: alert, cooperative and no distress  Respiratory: breathing non-labored at rest  Integumentary: I&D to left gluteal region, penrose x3 in place, induration and erythema improved  Labs:  CBC Latest Ref Rng & Units 01/14/2021 01/13/2021 01/11/2021  WBC 4.0 - 10.5 K/uL 14.7(H) 13.4(H) 10.9(H)  Hemoglobin 13.0 - 17.0 g/dL 12.5(L) 13.2 13.0  Hematocrit 39.0 - 52.0 % 36.8(L) 39.0 37.3(L)  Platelets 150 - 400 K/uL 233 206 192   CMP Latest Ref Rng & Units 01/14/2021 01/13/2021 01/11/2021  Glucose 70 - 99 mg/dL 767(H) 99 419(F)  BUN 6 - 20 mg/dL 9 10 17   Creatinine 0.61 - 1.24 mg/dL 7.90 2.40  Sodium 135 - 145 mmol/L 137 132(L) 133(L)  Potassium 3.5 - 5.1 mmol/L 2.9(L) 3.0(L) 2.7(LL)  Chloride 98 - 111 mmol/L 97(L) 92(L) 94(L)  CO2 22 - 32 mmol/L 31 27 28   Calcium 8.9 - 10.3  mg/dL 9.73) ) 5.3(G)  Total Protein 6.5 - 8.1 g/dL - 7.2 -  Total Bilirubin 0.3 - 1.2 mg/dL - 1.2 -  Alkaline Phos 38 - 126 U/L - 123 -  AST 15 - 41 U/L - 83(H) -  ALT 0 - 44 U/L - 69(H) -    Imaging studies: No new pertinent imaging studies   Assessment/Plan:  60 y.o. male with hypokalemia otherwise doing well 1 Day Post-Op s/p incision and drainage of left gluteal and ischiorectal abscess,   - Okay for regular diet  - Continue IVF w/ K today  - Replete K+; monitor  - Continue IV ABx (Zosyn); Follow up OR Cx  - Wound Care: Change outer dressing daily + prn; maintain penrose drains   - Pain control prn   - Mobilization as tolerated    - Discharge Planning: Pending improvement in K+; hopefully tomorrow afternoon   All of the above findings and recommendations were discussed with the patient, and the medical team, and all of patient's questions were answered to his expressed satisfaction.  -- 4.2(A, PA-C [facility] 01/14/2021, 7:14 AM [phone] M-F: 7am - 4pm

## 2021-01-14 NOTE — Plan of Care (Signed)
  Problem: Clinical Measurements: Goal: Ability to maintain clinical measurements within normal limits will improve Outcome: Progressing Goal: Will remain free from infection Outcome: Progressing Goal: Diagnostic test results will improve Outcome: Progressing Goal: Respiratory complications will improve Outcome: Progressing Goal: Cardiovascular complication will be avoided Outcome: Progressing   Problem: Activity: Goal: Risk for activity intolerance will decrease Outcome: Progressing   Problem: Pain Managment: Goal: General experience of comfort will improve Outcome: Progressing   Problem: Safety: Goal: Ability to remain free from injury will improve Outcome: Progressing   Problem: Skin Integrity: Goal: Risk for impaired skin integrity will decrease Outcome: Progressing   Pt is involved in and agrees with the plan of care. V/S stable. Sched tylenol given for minimal pain. Dressing on perirectal area changed. Voiding well.

## 2021-01-14 NOTE — Progress Notes (Signed)
Dressing changed. Education and teaching provided to the patient and his wife on how to change dressing at home. Patient and wife voiced understanding.

## 2021-01-15 ENCOUNTER — Ambulatory Visit: Payer: Self-pay | Admitting: Nurse Practitioner

## 2021-01-15 LAB — BASIC METABOLIC PANEL
Anion gap: 6 (ref 5–15)
BUN: 15 mg/dL (ref 6–20)
CO2: 29 mmol/L (ref 22–32)
Calcium: 8.2 mg/dL — ABNORMAL LOW (ref 8.9–10.3)
Chloride: 105 mmol/L (ref 98–111)
Creatinine, Ser: 1.04 mg/dL (ref 0.61–1.24)
GFR, Estimated: >60 mL/min (ref >60)
Glucose, Bld: 123 mg/dL — ABNORMAL HIGH (ref 70–99)
Potassium: 3.2 mmol/L — ABNORMAL LOW (ref 3.5–5.1)
Sodium: 140 mmol/L (ref 135–145)

## 2021-01-15 LAB — CBC
HCT: 33.4 % — ABNORMAL LOW (ref 39.0–52.0)
Hemoglobin: 11.5 g/dL — ABNORMAL LOW (ref 13.0–17.0)
MCH: 28.9 pg (ref 26.0–34.0)
MCHC: 34.4 g/dL (ref 30.0–36.0)
MCV: 83.9 fL (ref 80.0–100.0)
Platelets: 229 10*3/uL (ref 150–400)
RBC: 3.98 MIL/uL — ABNORMAL LOW (ref 4.22–5.81)
RDW: 14.1 % (ref 11.5–15.5)
WBC: 9.3 10*3/uL (ref 4.0–10.5)
nRBC: 0 % (ref 0.0–0.2)

## 2021-01-15 MED ORDER — IBUPROFEN 600 MG PO TABS: 600.0000 mg | ORAL_TABLET | Freq: Four times a day (QID) | ORAL | 0 refills | Status: DC | PRN

## 2021-01-15 MED ORDER — AMOXICILLIN-POT CLAVULANATE 875-125 MG PO TABS: 1.0000 | ORAL_TABLET | Freq: Two times a day (BID) | ORAL | 0 refills | Status: AC

## 2021-01-15 MED ORDER — POTASSIUM CHLORIDE CRYS ER 20 MEQ PO TBCR
40.0000 meq | EXTENDED_RELEASE_TABLET | ORAL | Status: DC
  Administered 2021-01-15: 40 meq via ORAL
  Filled 2021-01-15: qty 2

## 2021-01-15 NOTE — Plan of Care (Signed)
  Problem: Education: Goal: Knowledge of General Education information will improve Description: Including pain rating scale, medication(s)/side effects and non-pharmacologic comfort measures 01/15/2021 1244 by Gerarda Gunther, RN Outcome: Adequate for Discharge 01/15/2021 0954 by Gerarda Gunther, RN Outcome: Progressing   Problem: Health Behavior/Discharge Planning: Goal: Ability to manage health-related needs will improve 01/15/2021 1244 by Gerarda Gunther, RN Outcome: Adequate for Discharge 01/15/2021 0954 by Gerarda Gunther, RN Outcome: Progressing   Problem: Clinical Measurements: Goal: Ability to maintain clinical measurements within normal limits will improve 01/15/2021 1244 by Gerarda Gunther, RN Outcome: Adequate for Discharge 01/15/2021 0954 by Gerarda Gunther, RN Outcome: Progressing Goal: Will remain free from infection 01/15/2021 1244 by Gerarda Gunther, RN Outcome: Adequate for Discharge 01/15/2021 0954 by Gerarda Gunther, RN Outcome: Progressing Goal: Diagnostic test results will improve 01/15/2021 1244 by Gerarda Gunther, RN Outcome: Adequate for Discharge 01/15/2021 0954 by Gerarda Gunther, RN Outcome: Progressing Goal: Respiratory complications will improve 01/15/2021 1244 by Gerarda Gunther, RN Outcome: Adequate for Discharge 01/15/2021 0954 by Gerarda Gunther, RN Outcome: Progressing Goal: Cardiovascular complication will be avoided 01/15/2021 1244 by Gerarda Gunther, RN Outcome: Adequate for Discharge 01/15/2021 0954 by Gerarda Gunther, RN Outcome: Progressing   Problem: Activity: Goal: Risk for activity intolerance will decrease 01/15/2021 1244 by Gerarda Gunther, RN Outcome: Adequate for Discharge 01/15/2021 0954 by Gerarda Gunther, RN Outcome: Progressing   Problem: Nutrition: Goal: Adequate nutrition will be maintained 01/15/2021 1244 by Gerarda Gunther, RN Outcome: Adequate for Discharge 01/15/2021 0954 by Gerarda Gunther, RN Outcome: Progressing   Problem:  Coping: Goal: Level of anxiety will decrease 01/15/2021 1244 by Gerarda Gunther, RN Outcome: Adequate for Discharge 01/15/2021 0954 by Gerarda Gunther, RN Outcome: Progressing   Problem: Elimination: Goal: Will not experience complications related to bowel motility 01/15/2021 1244 by Gerarda Gunther, RN Outcome: Adequate for Discharge 01/15/2021 0954 by Gerarda Gunther, RN Outcome: Progressing Goal: Will not experience complications related to urinary retention 01/15/2021 1244 by Gerarda Gunther, RN Outcome: Adequate for Discharge 01/15/2021 0954 by Gerarda Gunther, RN Outcome: Progressing   Problem: Pain Managment: Goal: General experience of comfort will improve 01/15/2021 1244 by Gerarda Gunther, RN Outcome: Adequate for Discharge 01/15/2021 0954 by Gerarda Gunther, RN Outcome: Progressing   Problem: Safety: Goal: Ability to remain free from injury will improve 01/15/2021 1244 by Gerarda Gunther, RN Outcome: Adequate for Discharge 01/15/2021 0954 by Gerarda Gunther, RN Outcome: Progressing   Problem: Skin Integrity: Goal: Risk for impaired skin integrity will decrease 01/15/2021 1244 by Gerarda Gunther, RN Outcome: Adequate for Discharge 01/15/2021 0954 by Gerarda Gunther, RN Outcome: Progressing

## 2021-01-15 NOTE — Discharge Summary (Signed)
[facility] SUR[facility]ICAL DISCHARGE SUMMARY  Patient ID: JAMARRIUS SALAY MRN: [id number] DOB/AGE: [birth year] 60 y.o.  Admit date: 01/13/2021 Discharge date: 01/15/2021  Discharge Diagnoses Patient Active Problem List   Diagnosis Date Noted  . Perianal abscess   . Perirectal abscess     Consultants None  Procedures 01/13/2021:  Incision and Drainage of left gluteal and ischiorectal abscess  HPI: Daniel Dowson Stricklandis a 60 y.o.malepresenting to clinic for evaluation of left buttocks/perianal abscess.He presented initially to the ER on 5/7 with an area on his left medial buttocks which was growing in size and getting more tender. He reports that he has a history of this happening about 3 prior times in his life, and all of which have resolved on their own without any procedures or drainage or antibiotics. However, this time around, the area was not resolving after starting a few days prior to ED visit. On exam, he had an area of edema/induration, but no fluctuance, so no I&D was done. He had a mildly elevated WBC of 10.9 and also was found to have low K at 2.7. He was given IV clindamycin and po clindamycin for home as well as po potassium.   He called this morning as part of his instructions for follow up, and reports that the area is growing in size. He has been taking his antibiotic as prescribed. He was also advised to be fasting this morning for possible surgery. He reports that now the area is larger, more tender, and harder. Denies any drainage yet but feels like something could burst. Reports tenderness, but denies fevers/chills, chest pain, shortness of breath, nausea, or vomiting.   Hospital Course: Informed consent was obtained and documented, and patient underwent uneventful incision and drainage of perirectal abscess (Dr Aleen Campi, 01/13/2021).  Post-operatively, patient's pain and symptoms improved/resolved and advancement of patient's diet and ambulation were  well-tolerated. The remainder of patient's hospital course was essentially unremarkable, and discharge planning was initiated accordingly with patient safely able to be discharged home with appropriate discharge instructions, antibiotics (augmentin x14 days), pain control, and outpatient follow-up after all of his questions were answered to their expressed satisfaction.   Discharge Condition: Good    Physical Examination:  Constitutional: Well appearing male, NAD Pulmonary: Normal effort , no respiratory distress Skin: Incision and drainage site to left gluteus, more midline, surrounding tissue is indurated but continues to improve, no purulence or areas of undrained fluctuance   Allergies as of 01/15/2021   No Known Allergies     Medication List    STOP taking these medications   clindamycin 150 MG capsule Commonly known as: Cleocin   ketorolac 10 MG tablet Commonly known as: TORADOL     TAKE these medications   amoxicillin-clavulanate 875-125 MG tablet Commonly known as: Augmentin Take 1 tablet by mouth 2 (two) times daily for 14 days.   ibuprofen 600 MG tablet Commonly known as: ADVIL Take 1 tablet (600 mg total) by mouth every 6 (six) hours as needed.   oxyCODONE-acetaminophen 7.5-325 MG tablet Commonly known as: Percocet Take 1 tablet by mouth every 6 (six) hours as needed for up to 5 days for severe pain.   potassium chloride 10 MEQ tablet Commonly known as: KLOR-CON Take 1 tablet (10 mEq total) by mouth 2 (two) times daily.            Discharge Care Instructions  (From admission, onward)         Start     Ordered  01/15/21 0000  Discharge wound care:       Comments: Cover wound with dry gauze, ABD pab, mesh underwear or tape Okay to remove to shower, change after   01/15/21 1220            Follow-up Information    Henrene Dodge, MD. Schedule an appointment as soon as possible for a visit on 01/20/2021.   Specialty: General Surgery Why: MAKE  APPOINTMENT FOR [facility]...s/p perirectal I&D, has penrose drains Contact information: [address] [place] [address] [phone]        Smitty Cords, DO. Schedule an appointment as soon as possible for a visit in 1 week(s).   Specialty: Family Medicine Why: hospital follow up for HTN, Bradycardia, and low potassium  Contact information: [address] [place] [address] [phone]                Time spent on discharge management including discussion of hospital course, clinical condition, outpatient instructions, prescriptions, and follow up with the patient and members of the medical team: >30 minutes  -- Lynden Oxford , PA-C [facility]  01/15/2021, 12:21 PM [phone] M-F: 7am - 4pm

## 2021-01-15 NOTE — Plan of Care (Signed)

## 2021-01-15 NOTE — Progress Notes (Addendum)
Patient BP reading this am 190/98, 171/107, 179/102, and the HR highest reading is 52, provider informed via secure chat.   Patient is asymptomatic.

## 2021-01-15 NOTE — Progress Notes (Signed)
Pt heart rate at 46 and Bp-148/93. Pt asymptomatic. Provider informed.

## 2021-01-15 NOTE — Discharge Instructions (Signed)
In addition to included general post-operative instructions,  Diet: Resume home diet.   Wound care: You have three penrose drains in place. We will remove these sequentially as an outpatient. Cover wound with dry gauze +/- ABD pad, secure with tape and/or mesh gauze. Okay to shower, remove dressing prior, pat dry. Recommend showering or using baby wipes after each bowel movement   Medications:  For mild to moderate pain: acetaminophen (Tylenol) or ibuprofen/naproxen (if no kidney disease). Combining Tylenol with alcohol can substantially increase your risk of causing liver disease. Narcotic pain medications, if prescribed, can be used for severe pain, though may cause nausea, constipation, and drowsiness. Do not combine Tylenol and Percocet (or similar) within a 6 hour period as Percocet (and similar) contain(s) Tylenol. If you do not need the narcotic pain medication, you do not need to fill the prescription.  Call office [phone] / [phone]) at any time if any questions, worsening pain, fevers/chills, bleeding, drainage from incision site, or other concerns.

## 2021-01-16 ENCOUNTER — Telehealth: Payer: Self-pay | Admitting: Surgery

## 2021-01-16 NOTE — Telephone Encounter (Signed)
Outbound call made to the pt; left voice msg req'n a call back to schedule an ED f/u I&D of L gluteal and ischiorectal abscess (01/13/21).  Per Dr. Aleen Campi, the pt may want to f/u w/him in the [facility] clinic for convenience.  Thank you

## 2021-01-18 LAB — AEROBIC/ANAEROBIC CULTURE W GRAM STAIN (SURGICAL/DEEP WOUND): Gram Stain: NONE SEEN

## 2021-01-20 ENCOUNTER — Encounter: Payer: Self-pay | Admitting: Surgery

## 2021-01-20 ENCOUNTER — Other Ambulatory Visit: Payer: Self-pay

## 2021-01-20 ENCOUNTER — Ambulatory Visit (INDEPENDENT_AMBULATORY_CARE_PROVIDER_SITE_OTHER): Payer: BC Managed Care – PPO | Admitting: Surgery

## 2021-01-20 VITALS — BP 185/116 | HR 64 | Temp 98.6°F | Ht 71.75 in | Wt 237.4 lb

## 2021-01-20 DIAGNOSIS — Z09 Encounter for follow-up examination after completed treatment for conditions other than malignant neoplasm: Secondary | ICD-10-CM

## 2021-01-20 DIAGNOSIS — K6139 Other ischiorectal abscess: Secondary | ICD-10-CM

## 2021-01-20 DIAGNOSIS — K610 Anal abscess: Secondary | ICD-10-CM

## 2021-01-20 NOTE — Patient Instructions (Signed)
If you have any concerns or questions, please feel free to call our office. See follow up appointment below. 

## 2021-01-20 NOTE — Progress Notes (Signed)
01/20/2021  HPI: SUMMIT ARROYAVE is a 60 y.o. male s/p I&D of left perianal and ischiorectal abscess on 01/13/21.  He presents today for follow up.  Denies any significant pain, and reports still having drainage, but it's not purulent.  Vital signs: BP (!) 185/116   Pulse 64   Temp 98.6 F (37 C) (Oral)   Ht 5' 11.75" (1.822 m)   Wt 237 lb 6.4 oz (107.7 kg)   SpO2 97%   BMI 32.42 kg/m    Physical Exam: Constitutional:  No acute distress Skin:  Left buttocks incisions are clean, with granulation tissue at the base of the wounds.  No further cellulitis, and only minimal induration at the base of the buttocks.  Three penrose drains in place.  Removed the superior drain today.  Dressed with dry gauze.  Assessment/Plan: This is a 60 y.o. male s/p I&D of left perianal and ischiorectal abscess  --patient doing well, without complications.  One drain removed today, two remain in place. --Follow up on Friday 5/20 for another drain removal.   Howie Ill, MD [facility]

## 2021-01-24 ENCOUNTER — Other Ambulatory Visit: Payer: Self-pay

## 2021-01-24 ENCOUNTER — Ambulatory Visit (INDEPENDENT_AMBULATORY_CARE_PROVIDER_SITE_OTHER): Payer: BC Managed Care – PPO | Admitting: Surgery

## 2021-01-24 ENCOUNTER — Encounter: Payer: Self-pay | Admitting: Surgery

## 2021-01-24 VITALS — BP 172/101 | HR 63 | Temp 98.3°F | Ht 71.0 in | Wt 235.0 lb

## 2021-01-24 DIAGNOSIS — Z09 Encounter for follow-up examination after completed treatment for conditions other than malignant neoplasm: Secondary | ICD-10-CM

## 2021-01-24 DIAGNOSIS — K6139 Other ischiorectal abscess: Secondary | ICD-10-CM

## 2021-01-24 NOTE — Progress Notes (Signed)
01/24/2021  HPI: Daniel Steele is a 60 y.o. male s/p incision and drainage of left gluteal and ischiorectal abscess on 01/13/2021.  Patient presents today for follow-up.  He was last seen on 5/16 at which time one of the drains was removed.  He reports that the drainage has become more clear.  Denies any significant discomfort.  Vital signs: BP (!) 172/101   Pulse 63   Temp 98.3 F (36.8 C)   Ht 5\' 11"  (1.803 m)   Wt 235 lb (106.6 kg)   SpO2 96%   BMI 32.78 kg/m    Physical Exam: Constitutional: No acute distress Skin: Left buttocks incisions appear to be healing well.  The site where previous drain had been removed is closing up.  There is no purulent drainage.  Superior drain was removed today leaving only the most inferior independent drain left.  Dry gauze dressing applied.  No further cellulitis or induration.  Assessment/Plan: This is a 60 y.o. male s/p incision and drainage of left gluteal and ischiorectal abscess.  - Second drain has been removed today with 1 more drain remaining in place.  Dry gauze dressing applied. - Patient is doing well otherwise with no evidence of recurrence or worsening. - Follow-up next week for removal of his last drain.   41, MD [facility]

## 2021-01-24 NOTE — Patient Instructions (Signed)
We have removed one of your drains today. You may shower but do not submerge the area. We will have you follow up next Wednesday and possible remove the other drain.  Continue your wound management as usual.

## 2021-01-29 ENCOUNTER — Ambulatory Visit (INDEPENDENT_AMBULATORY_CARE_PROVIDER_SITE_OTHER): Payer: BC Managed Care – PPO | Admitting: Surgery

## 2021-01-29 ENCOUNTER — Encounter: Payer: Self-pay | Admitting: Surgery

## 2021-01-29 ENCOUNTER — Other Ambulatory Visit: Payer: Self-pay

## 2021-01-29 VITALS — BP 190/104 | HR 88 | Temp 98.3°F | Ht 71.0 in | Wt 235.0 lb

## 2021-01-29 DIAGNOSIS — K6139 Other ischiorectal abscess: Secondary | ICD-10-CM

## 2021-01-29 DIAGNOSIS — Z09 Encounter for follow-up examination after completed treatment for conditions other than malignant neoplasm: Secondary | ICD-10-CM

## 2021-01-29 NOTE — Patient Instructions (Signed)
We removed the drain today. The wound looks good. Finish all Antibiotic. Do not submerge in a pool /hot tub/or bath tub . You may resume your gym activities but shower afterwards.   Please call our office if you have any questions or concerns.

## 2021-01-29 NOTE — Progress Notes (Signed)
01/29/2021  HPI: Daniel Steele is a 60 y.o. male s/p incision and drainage of left gluteal and ischial rectal abscess on 01/13/2021.  Patient presents for removal of his last Penrose drain.  Patient reports that he has been doing well with no complications.  The drainage is more clear.  Denies any worsening pain, and he is almost done with the antibiotic course.  Vital signs: BP (!) 190/104   Pulse 88   Temp 98.3 F (36.8 C)   Ht 5\' 11"  (1.803 m)   Wt 235 lb (106.6 kg)   SpO2 98%   BMI 32.78 kg/m    Physical Exam: Constitutional: No acute distress Skin: Perianal area has 4 incisions which are healing well with appropriate granulation tissue with no evidence of purulence and no further induration or erythema.  Last Penrose drain was removed without complications.  Dry gauze dressing applied  Assessment/Plan: This is a 60 y.o. male s/p incision and drainage of left gluteal and ischial rectal abscess.  - Last Penrose drain removed today.  Patient is healing well without any evidence of worsening infection.  There is no further purulence, erythema, or induration.  Discussed with him that he should continue dry gauze dressing over the wounds until everything is healed.  Avoid from submerging the wounds until fully healed.  Complete antibiotic course. - Follow-up as needed.   41, MD [facility]

## 2021-02-19 ENCOUNTER — Encounter: Payer: Self-pay | Admitting: Emergency Medicine

## 2021-02-19 ENCOUNTER — Other Ambulatory Visit: Payer: Self-pay

## 2021-02-19 ENCOUNTER — Encounter: Payer: Self-pay | Admitting: Surgery

## 2021-02-19 ENCOUNTER — Emergency Department
Admission: EM | Admit: 2021-02-19 | Discharge: 2021-02-19 | Disposition: A | Discharge status: Home / Self Care | Payer: BC Managed Care – PPO | Attending: Emergency Medicine | Admitting: Emergency Medicine

## 2021-02-19 ENCOUNTER — Ambulatory Visit (INDEPENDENT_AMBULATORY_CARE_PROVIDER_SITE_OTHER): Payer: BC Managed Care – PPO | Admitting: Surgery

## 2021-02-19 ENCOUNTER — Ambulatory Visit: Payer: Self-pay | Admitting: *Deleted

## 2021-02-19 VITALS — BP 198/129 | HR 80 | Temp 98.4°F | Ht 71.5 in | Wt 242.2 lb

## 2021-02-19 DIAGNOSIS — I10 Essential (primary) hypertension: Secondary | ICD-10-CM | POA: Diagnosis not present

## 2021-02-19 DIAGNOSIS — K6139 Other ischiorectal abscess: Secondary | ICD-10-CM

## 2021-02-19 DIAGNOSIS — Z09 Encounter for follow-up examination after completed treatment for conditions other than malignant neoplasm: Secondary | ICD-10-CM

## 2021-02-19 DIAGNOSIS — E876 Hypokalemia: Secondary | ICD-10-CM | POA: Diagnosis not present

## 2021-02-19 DIAGNOSIS — Z79899 Other long term (current) drug therapy: Secondary | ICD-10-CM | POA: Insufficient documentation

## 2021-02-19 LAB — COMPREHENSIVE METABOLIC PANEL
ALT: 14 U/L (ref 0–44)
AST: 23 U/L (ref 15–41)
Albumin: 3.9 g/dL (ref 3.5–5.0)
Alkaline Phosphatase: 72 U/L (ref 38–126)
Anion gap: 10 (ref 5–15)
BUN: 8 mg/dL (ref 6–20)
CO2: 31 mmol/L (ref 22–32)
Calcium: 8.8 mg/dL — ABNORMAL LOW (ref 8.9–10.3)
Chloride: 99 mmol/L (ref 98–111)
Creatinine, Ser: 0.88 mg/dL (ref 0.61–1.24)
GFR, Estimated: >60 mL/min (ref >60)
Glucose, Bld: 104 mg/dL — ABNORMAL HIGH (ref 70–99)
Potassium: 2.9 mmol/L — ABNORMAL LOW (ref 3.5–5.1)
Sodium: 140 mmol/L (ref 135–145)
Total Bilirubin: 1 mg/dL (ref 0.3–1.2)
Total Protein: 7.6 g/dL (ref 6.5–8.1)

## 2021-02-19 LAB — CBC
HCT: 38.6 % — ABNORMAL LOW (ref 39.0–52.0)
Hemoglobin: 13.2 g/dL (ref 13.0–17.0)
MCH: 28.4 pg (ref 26.0–34.0)
MCHC: 34.2 g/dL (ref 30.0–36.0)
MCV: 83 fL (ref 80.0–100.0)
Platelets: 200 10*3/uL (ref 150–400)
RBC: 4.65 MIL/uL (ref 4.22–5.81)
RDW: 14.3 % (ref 11.5–15.5)
WBC: 8.1 10*3/uL (ref 4.0–10.5)
nRBC: 0 % (ref 0.0–0.2)

## 2021-02-19 LAB — TROPONIN I (HIGH SENSITIVITY): Troponin I (High Sensitivity): 11 ng/L (ref <18)

## 2021-02-19 MED ORDER — AMLODIPINE BESYLATE 5 MG PO TABS: 5.0000 mg | ORAL_TABLET | Freq: Every day | ORAL | 11 refills | Status: DC

## 2021-02-19 MED ORDER — POTASSIUM CHLORIDE CRYS ER 20 MEQ PO TBCR
80.0000 meq | EXTENDED_RELEASE_TABLET | Freq: Once | ORAL | Status: AC
  Administered 2021-02-19: 80 meq via ORAL
  Filled 2021-02-19: qty 4

## 2021-02-19 MED ORDER — AMOXICILLIN-POT CLAVULANATE 875-125 MG PO TABS: 1.0000 | ORAL_TABLET | Freq: Two times a day (BID) | ORAL | 0 refills | Status: AC

## 2021-02-19 NOTE — Telephone Encounter (Signed)
Shelia,CMA from [facility] calling to report pt's BP 198/128. NT unable to speak to pt presently. Silvio Pate states pt is asymptomatic except he states "Energy level is down." Questioning if pt could be seen at practice today. Advised pt is not yet established, has NP appt in July. Verified with Rachael, not able to be seen as not established. Advised ED. CMA verbalizes understanding.  Reason for Disposition . [1] Systolic BP  >= 160 OR Diastolic >= 100 AND [2] cardiac or neurologic symptoms (e.g., chest pain, difficulty breathing, unsteady gait, blurred vision)  Protocols used: Blood Pressure - High-A-AH

## 2021-02-19 NOTE — Progress Notes (Signed)
02/19/2021  HPI: RAYMIR FROMMELT is a 60 y.o. male s/p I&D of left ischiorectal and perianal abscess on 01/13/21.  His last penrose drain was removed on 5/25.  He had been doing well until about 2 days ago he noted some swelling in the left perianal area and the following day started having clear fluid drainage.  Denies any pain in the area and denies any fevers or chills.    Vital signs: BP (!) 198/129   Pulse 80   Temp 98.4 F (36.9 C) (Oral)   Ht 5' 11.5" (1.816 m)   Wt 242 lb 3.2 oz (109.9 kg)   SpO2 94%   BMI 33.31 kg/m    Physical Exam: Constitutional:  No acute distress Skin:  The perianal area was examined and the patient's prior I&D sites are healing well, except for the left perianal incision.  Probing this wound reveals a cavity of about 2.5 cm size with some purulent drainage.  However, there is no associated tenderness, erythema, or induration.  The cavity was irrigated with saline and packed with 1/4 inch gauze.  The most superior incision is still partially opened, though very shallow and with good granulation tissue.  Assessment/Plan: This is a 60 y.o. male s/p I&D of left ischiorectal and perianal abscess  --Appears there was a small pocket of purulent fluid that had not fully drained prior to the incision site starting to close. I do not think this is a new abscess as there are no signs of inflammation or infection other than the purulent fluid.  However, as a precaution, will give the patient a 7 day course of Augmentin.  Instructed the patient on how to do packing dressing changes daily. --Return precautions given. --Follow up in 2 weeks.   Howie Ill, MD [facility]

## 2021-02-19 NOTE — Patient Instructions (Addendum)
Please go to the emergency room for your blood pressure. Continue to shower after each bowel movement. If you wife is comfortable removing the old packing and replacing with new packing then you will need to do once daily or after each bowel movement.   Please pick up your medication at the pharmacy.

## 2021-02-19 NOTE — ED Provider Notes (Signed)
[facility] Emergency Department Provider Note  ____________________________________________   Event Date/Time   First MD Initiated Contact with Patient 02/19/21 1525     (approximate)  I have reviewed the triage vital signs and the nursing notes.   HISTORY  Chief Complaint Hypertension   HPI Daniel Steele is a 60 y.o. male with a past medical history of hypertension although not taking any antihypertensives for the last couple of years and recent perirectal abscess status post I&D who presents after being referred to the ED from routine outpatient surgery clinical follow-up visit for assessment of elevated blood pressures noted at the clinic.  Patient states he is a strong family history of high blood pressure and was trying to get off medicines using diet and exercise.  He states he has been little sweaty over the last couple nights otherwise has not had any headaches, vision changes, chest pain, cough, shortness of breath, abdominal pain, nausea, vomiting, diarrhea, dysuria, rash or focal extremity pain weakness or numbness.  States he has been healing well from his I&D of the abscess.  He denies tobacco abuse or illicit drug use regular EtOH use but does endorse drinking a fair amount of caffeine every day.  No other acute concerns at this time.         Past Medical History:  Diagnosis Date   Family history of malignant hyperthermia    father and cousin   Hypertension     Patient Active Problem List   Diagnosis Date Noted   Perianal abscess    Perirectal abscess     Past Surgical History:  Procedure Laterality Date   arm surgery     CHOLECYSTECTOMY     INCISION AND DRAINAGE ABSCESS N/A 01/13/2021   Procedure: INCISION AND DRAINAGE ABSCESS perirectal;  Surgeon: Henrene Dodge, MD;  Location: [facility] ORS;  Service: General;  Laterality: N/A;    Prior to Admission medications   Medication Sig Start Date End Date Taking? Authorizing Provider   amLODipine (NORVASC) 5 MG tablet Take 1 tablet (5 mg total) by mouth daily. 02/19/21 02/19/22 Yes Gilles Chiquito, MD  amoxicillin-clavulanate (AUGMENTIN) 875-125 MG tablet Take 1 tablet by mouth 2 (two) times daily for 14 days. 02/19/21 03/05/21  Henrene Dodge, MD  potassium chloride (KLOR-CON) 10 MEQ tablet Take 1 tablet (10 mEq total) by mouth 2 (two) times daily. 01/11/21   Joni Reining, PA-C    Allergies Patient has no known allergies.  Family History  Problem Relation Age of Onset   Cancer Father     Social History Social History   Tobacco Use   Smoking status: Never   Smokeless tobacco: Never  Substance Use Topics   Alcohol use: Never   Drug use: Never    Review of Systems  Review of Systems  Constitutional:  Positive for diaphoresis (at night). Negative for chills and fever.  HENT:  Negative for sore throat.   Eyes:  Negative for pain.  Respiratory:  Negative for cough and stridor.   Cardiovascular:  Negative for chest pain.  Gastrointestinal:  Negative for vomiting.  Genitourinary:  Negative for dysuria.  Musculoskeletal:  Negative for myalgias.  Skin:  Negative for rash.  Neurological:  Negative for seizures, loss of consciousness and headaches.  Psychiatric/Behavioral:  Negative for suicidal ideas.   All other systems reviewed and are negative.    ____________________________________________   PHYSICAL EXAM:  VITAL SIGNS: ED Triage Vitals  Enc Vitals Group     BP  02/19/21 1258 (!) 179/132     Pulse Rate 02/19/21 1258 74     Resp 02/19/21 1258 18     Temp 02/19/21 1258 97.8 F (36.6 C)     Temp Source 02/19/21 1258 Oral     SpO2 02/19/21 1258 95 %     Weight 02/19/21 1255 242 lb (109.8 kg)     Height 02/19/21 1255 5' 11.5" (1.816 m)     Head Circumference --      Peak Flow --      Pain Score 02/19/21 1254 0     Pain Loc --      Pain Edu? --      Excl. in GC? --    Vitals:   02/19/21 1258  BP: (!) 179/132  Pulse: 74  Resp: 18  Temp: 97.8  F (36.6 C)  SpO2: 95%   Physical Exam Vitals and nursing note reviewed.  Constitutional:      Appearance: He is well-developed.  HENT:     Head: Normocephalic and atraumatic.     Right Ear: External ear normal.     Left Ear: External ear normal.     Nose: Nose normal.     Mouth/Throat:     Mouth: Mucous membranes are moist.  Eyes:     Conjunctiva/sclera: Conjunctivae normal.  Cardiovascular:     Rate and Rhythm: Normal rate and regular rhythm.     Heart sounds: No murmur heard. Pulmonary:     Effort: Pulmonary effort is normal. No respiratory distress.     Breath sounds: Normal breath sounds.  Abdominal:     Palpations: Abdomen is soft.     Tenderness: There is no abdominal tenderness.  Musculoskeletal:     Cervical back: Neck supple.  Skin:    General: Skin is warm and dry.     Capillary Refill: Capillary refill takes less than 2 seconds.  Neurological:     Mental Status: He is alert and oriented to person, place, and time.    Cranial nerves II through XII grossly intact.  No pronator drift.  No finger dysmetria.  Symmetric 5/5 strength of all extremities.  Sensation intact to light touch in all extremities.  Unremarkable unassisted gait.  ____________________________________________   LABS (all labs ordered are listed, but only abnormal results are displayed)  Labs Reviewed  CBC - Abnormal; Notable for the following components:      Result Value   HCT 38.6 (*)    All other components within normal limits  COMPREHENSIVE METABOLIC PANEL - Abnormal; Notable for the following components:   Potassium 2.9 (*)    Glucose, Bld 104 (*)    Calcium 8.8 (*)    All other components within normal limits  TROPONIN I (HIGH SENSITIVITY)  TROPONIN I (HIGH SENSITIVITY)   ____________________________________________  EKG  Sinus bradycardia with ventricular rate of 53, no clear evidence of acute ischemia.  ____________________________________________  RADIOLOGY  ED MD  interpretation:    Official radiology report(s): No results found.  ____________________________________________   PROCEDURES  Procedure(s) performed (including Critical Care):  Procedures   ____________________________________________   INITIAL IMPRESSION / ASSESSMENT AND PLAN / ED COURSE      Patient presents with above-stated history exam for assessment of elevated blood pressure seen at outpatient surgery clinic where he was getting evaluated after recent I&D.  He has been healing well from this.  He states he was previously treated for hypertension but not currently taking anything.  On arrival to emergency  room his BP is 179/132.  Other than feeling sweaty of last couple nights he has not had any other associated sick symptoms.  He has a nonfocal neuro exam.  ECG shows bradycardia without ischemia or other significant changes.  CBC shows no leukocytosis or acute anemia.  Troponin is nonelevated and given otherwise reassuring EKG not consistent with ACS.  CMP shows a K of 2.9 without any other significant electrolyte or metabolic derangements.  Kidney function is within normal overall history exam is not consistent with dissection, SAH, CVA or other endorgan damage from elevated blood pressures.  Suspect likely asymptomatic hypertension at this time.  We will start patient on low-dose amlodipine.  Counseled him on caffeine cessation and close outpatient follow-up.  Discharged stable condition.  Strict return precautions advised and discussed.        ____________________________________________   FINAL CLINICAL IMPRESSION(S) / ED DIAGNOSES  Final diagnoses:  Hypertension, unspecified type  Hypokalemia    Medications  potassium chloride SA (KLOR-CON) CR tablet 80 mEq (has no administration in time range)     ED Discharge Orders          Ordered    amLODipine (NORVASC) 5 MG tablet  Daily        02/19/21 1535             Note:  This document was prepared  using Dragon voice recognition software and may include unintentional dictation errors.    Gilles Chiquito, MD 02/19/21 1537

## 2021-02-19 NOTE — ED Triage Notes (Signed)
Pt comes into the ED via POV c/o HTn.  PT had a f/u with his surgeon today and they sent him here for high blood pressure.  Per the patient the last reading was 202/127.  Pt has a h/o HTN but is not taking medication at this time.  Pt denies any chest pain, dizziness, or headaches.  Pt ambulatory to triage at this timer with even and unlabored respirations.

## 2021-03-05 ENCOUNTER — Other Ambulatory Visit: Payer: Self-pay

## 2021-03-05 ENCOUNTER — Ambulatory Visit (INDEPENDENT_AMBULATORY_CARE_PROVIDER_SITE_OTHER): Payer: BC Managed Care – PPO | Admitting: Surgery

## 2021-03-05 ENCOUNTER — Encounter: Payer: Self-pay | Admitting: Surgery

## 2021-03-05 VITALS — BP 167/93 | HR 74 | Temp 98.5°F | Ht 71.5 in | Wt 240.0 lb

## 2021-03-05 DIAGNOSIS — K6139 Other ischiorectal abscess: Secondary | ICD-10-CM

## 2021-03-05 DIAGNOSIS — Z09 Encounter for follow-up examination after completed treatment for conditions other than malignant neoplasm: Secondary | ICD-10-CM

## 2021-03-05 NOTE — Patient Instructions (Signed)
You may stop packing the wound. Continue to wash the area with soap and water. Finish all of the Antibiotic. We applied Silver Nitrate to help speed up the healing process. Do not submerge in any pools or hot tubs.   Please call our office if you have any questions or concerns.

## 2021-03-05 NOTE — Progress Notes (Signed)
03/05/2021  HPI: Daniel Steele is a 60 y.o. male s/p I&D of left perianal and ischiorectal abscess on 01/13/21.  He presents for follow up.  He was last seen on 02/19/21 in the office due to new drainage that he was having from the left perianal area.  This area had a small pocket of purulent fluid that appeared to be residual from his initial large abscess.  This was drained better in the office and he was given a course of Augmentin as a precaution.  Today, he reports that the area drained last time is improving.  It is a lot smaller and today unable to be packed.  There is a little bit of drainage only which has improved compared to last time.  Denies any pain.  She also went to emergency room on 02/19/2021 because his blood pressure was very high in the office up to 198/129.  He was started on low-dose amlodipine and this morning his blood pressure is improved to 167/93.  He has a follow-up with his PCP this Friday.  Vital signs: BP (!) 167/93   Pulse 74   Temp 98.5 F (36.9 C) (Oral)   Ht 5' 11.5" (1.816 m)   Wt 240 lb (108.9 kg)   SpO2 96%   BMI 33.01 kg/m    Physical Exam: Constitutional: No acute distress Skin: Left perianal wound is almost closed with a very small size remaining I am unable to probe with a Q-tip.  No further packing is needed here.  The superior gluteal cleft wound is also healing well and very shallow measuring now about 5 mm in size.  Silver nitrate applied to this area both covered with dry gauze dressing  Assessment/Plan: This is a 60 y.o. male s/p I&D of left perianal and ischiorectal abscess.  -- Patient is healing well with no further evidence of an abscess.  I am unable to express any fluid from the perianal wound.  Apply silver nitrate to the superior gluteal cleft wound.  Patient is almost done with antibiotics and no further antibiotics are needed at this point. - Follow-up as needed.   Howie Ill, MD [facility]

## 2021-03-07 ENCOUNTER — Encounter: Payer: Self-pay | Admitting: Surgery

## 2021-03-07 ENCOUNTER — Other Ambulatory Visit: Payer: Self-pay

## 2021-03-07 ENCOUNTER — Encounter: Payer: Self-pay | Admitting: Family Medicine

## 2021-03-07 ENCOUNTER — Ambulatory Visit (INDEPENDENT_AMBULATORY_CARE_PROVIDER_SITE_OTHER): Payer: BC Managed Care – PPO | Admitting: Surgery

## 2021-03-07 ENCOUNTER — Ambulatory Visit: Payer: BC Managed Care – PPO | Admitting: Family Medicine

## 2021-03-07 VITALS — BP 158/91 | HR 92 | Ht 71.5 in | Wt 239.2 lb

## 2021-03-07 VITALS — BP 158/112 | HR 76 | Temp 98.3°F | Wt 240.0 lb

## 2021-03-07 DIAGNOSIS — R600 Localized edema: Secondary | ICD-10-CM

## 2021-03-07 DIAGNOSIS — I10 Essential (primary) hypertension: Secondary | ICD-10-CM | POA: Diagnosis not present

## 2021-03-07 DIAGNOSIS — Z7689 Persons encountering health services in other specified circumstances: Secondary | ICD-10-CM

## 2021-03-07 DIAGNOSIS — E876 Hypokalemia: Secondary | ICD-10-CM

## 2021-03-07 DIAGNOSIS — K610 Anal abscess: Secondary | ICD-10-CM

## 2021-03-07 DIAGNOSIS — K6139 Other ischiorectal abscess: Secondary | ICD-10-CM

## 2021-03-07 DIAGNOSIS — Z09 Encounter for follow-up examination after completed treatment for conditions other than malignant neoplasm: Secondary | ICD-10-CM

## 2021-03-07 MED ORDER — VALSARTAN 160 MG PO TABS: 160.0000 mg | ORAL_TABLET | Freq: Every day | ORAL | 1 refills | Status: DC

## 2021-03-07 NOTE — Progress Notes (Signed)
03/07/2021  HPI: Daniel Steele is a 60 y.o. male s/p I&D of left perianal and ischiorectal abscess on 01/13/21.  He was seen recently due to concerns for purulent drainage from one of the incisions.  It was felt that it could be residual fluid rather than recurrent infection as he was not having any pain or erythema/induration.  He was given a course of Augmentin as a precaution.  Today, he presents because again he felt drainage from the same area.  He reports that the wound had been healing well but today he felt it get bigger in size and drained purulent fluid.  Denies any pain, fevers, chills.  Vital signs: BP (!) 158/112   Pulse 76   Temp 98.3 F (36.8 C) (Oral)   Wt 240 lb (108.9 kg)   SpO2 94%   BMI 33.01 kg/m    Physical Exam: Constitutional:   No acute distress Skin:  Left perianal wound is very small, about 3-4 mm in size.  I can barely get a qtip in to probe without some force.  There was a bit of purulent fluid.  The cavity does feel smaller than it was on 6/15.  This was treated with silver nitrate to help it scar.  Assessment/Plan: This is a 60 y.o. male s/p I&D of left perianal and ischiorectal abscess  --Discussed with the patient that this does seem to be again a residual fluid collection that got bigger and spontaneously drained.  I think the skin wound healed faster than the cavity and new fluid formed and drained.  I do not think there's true active infection at this point.  The wound was probed with qtip and also treated with silver nitrate.  Wick of gauze was packed in and instructed patient on removing it tomorrow. --Follow up next week for wound check.   Howie Ill, MD [facility]

## 2021-03-07 NOTE — Progress Notes (Signed)
Subjective:    Patient ID: Daniel Steele, male    DOB: [birth year], 60 y.o.   MRN: [id number]  Daniel Steele is a 60 y.o. male presenting on 03/07/2021 for Establish Care and Hypertension  Establish care.  HPI  CHRONIC HTN: Reports he has been trying to manage his lifestyle to control HTN. He has strong fam history. He has been to ED in past few weeks 6/15 for HTN. They started with Amlodipine 5mg  daily, it has caused some swelling His sister is a nurse that has retired, she can help with BP monitoring. Home readings 140-50+ / 80+ Current Meds - Amlodipine 5mg    Reports good compliance, took meds today. Tolerating well, w/o complaints. Lifestyle: Not regularly consuming alcohol now, but in past age 10s he did drink more at the time, he has been alcohol free since 05/12/1987 - Diet: Admits caffeine can raise BP. Still improving diet but admits not eating right and has sugar in diet. 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 Previously did more heavy lifting He is mostly sedentary now and working with computer   Health Maintenance: Return for wellness later in year for health maintenance  No flowsheet data found.  Past Medical History:  Diagnosis Date   Family history of malignant hyperthermia    father and cousin   Hypertension    Past Surgical History:  Procedure Laterality Date   arm surgery     CHOLECYSTECTOMY  2011   INCISION AND DRAINAGE ABSCESS N/A 01/13/2021   Procedure: INCISION AND DRAINAGE ABSCESS perirectal;   Surgeon: 2012, MD;  Location: [facility] ORS;  Service: General;  Laterality: N/A;   Social History   Socioeconomic History   Marital status: Married    Spouse name: Not on file   Number of children: Not on file   Years of education: Not on file   Highest education level: Not on file  Occupational History   Not on file  Tobacco Use   Smoking status: Never   Smokeless tobacco: Never  Substance and Sexual Activity   Alcohol use: Not Currently    Comment: No alcohol since 05/1987   Drug use: Not Currently   Sexual activity: Not on file  Other Topics Concern   Not on file  Social History Narrative   Not on file   Social Determinants of Health   Financial Resource Strain: Not on file  Food Insecurity: Not on file  Transportation Needs: Not on file  Physical Activity: Not on file  Stress: Not on file  Social Connections: Not on file  Intimate Partner Violence: Not on file   Family History  Problem Relation Age of Onset   Ovarian cancer Mother    Cancer Father    Brain cancer Father    Depression Father    No current outpatient medications on file prior to visit.   No current facility-administered medications on file prior to visit.    Review of Systems Per HPI unless specifically indicated above      Objective:    BP (!) 158/91  Pulse 92   Ht 5' 11.5" (1.816 m)   Wt 239 lb 3.2 oz (108.5 kg)   SpO2 97%   BMI 32.90 kg/m   Wt Readings from Last 3 Encounters:  03/07/21 239 lb 3.2 oz (108.5 kg)  03/07/21 240 lb (108.9 kg)  03/05/21 240 lb (108.9 kg)    Physical Exam Vitals and nursing note reviewed.  Constitutional:      General: He is not in acute distress.    Appearance: He is well-developed. He is not diaphoretic.     Comments: Well-appearing, comfortable, cooperative  HENT:     Head: Normocephalic and atraumatic.  Eyes:     General:        Right eye: No discharge.        Left eye: No discharge.     Conjunctiva/sclera: Conjunctivae normal.   Neck:     Thyroid: No thyromegaly.  Cardiovascular:     Rate and Rhythm: Normal rate and regular rhythm.     Pulses: Normal pulses.     Heart sounds: Normal heart sounds. No murmur heard. Pulmonary:     Effort: Pulmonary effort is normal. No respiratory distress.     Breath sounds: Normal breath sounds. No wheezing or rales.  Musculoskeletal:        General: Normal range of motion.     Cervical back: Normal range of motion and neck supple.     Right lower leg: Edema (+1 non pitting edema) present.     Left lower leg: Edema (+1 non pitting edema) present.  Lymphadenopathy:     Cervical: No cervical adenopathy.  Skin:    General: Skin is warm and dry.     Findings: No erythema or rash.  Neurological:     Mental Status: He is alert and oriented to person, place, and time. Mental status is at baseline.  Psychiatric:        Behavior: Behavior normal.     Comments: Well groomed, good eye contact, normal speech and thoughts     Results for orders placed or performed during the hospital encounter of 02/19/21  CBC  Result Value Ref Range   WBC 8.1 4.0 - 10.5 K/uL   RBC 4.65 4.22 - 5.81 MIL/uL   Hemoglobin 13.2 13.0 - 17.0 g/dL   HCT 09.6 (L) 04.5 - 40.9 %   MCV 83.0 80.0 - 100.0 fL   MCH 28.4 26.0 - 34.0 pg   MCHC 34.2 30.0 - 36.0 g/dL   RDW 81.1 91.4 - 78.2 %   Platelets 200 150 - 400 K/uL   nRBC 0.0 0.0 - 0.2 %  Comprehensive metabolic panel  Result Value Ref Range   Sodium 140 135 - 145 mmol/L   Potassium 2.9 (L) 3.5 - 5.1 mmol/L   Chloride 99 98 - 111 mmol/L   CO2 31 22 - 32 mmol/L   Glucose, Bld 104 (H) 70 - 99 mg/dL   BUN 8 6 - 20 mg/dL   Creatinine, Ser 9.56 0.61 - 1.24 mg/dL   Calcium 8.8 (L) 8.9 - 10.3 mg/dL   Total Protein 7.6 6.5 - 8.1 g/dL   Albumin 3.9 3.5 - 5.0 g/dL   AST 23 15 - 41 U/L   ALT 14 0 - 44 U/L   Alkaline Phosphatase 72 38 - 126 U/L   Total Bilirubin 1.0 0.3 - 1.2 mg/dL   GFR, Estimated >21 >30 mL/min   Anion gap 10 5 - 15  Troponin I (High  Sensitivity)  Result Value Ref Range   Troponin I (High Sensitivity) 11 <18 ng/L      Assessment & Plan:   Problem List Items Addressed This Visit     Essential hypertension - Primary    Elevated BP - Home BP readings still elevated  W/ Hypokalemia Fam history HTN   Plan:  1. Stop Amlodipine 5mg  due to swelling 2. START Valsartan ARB 160mg  daily new rx - also has hyopK this may help. 3. Encourage improved lifestyle - low sodium diet, regular exercise 4. Start monitor BP outside office, bring readings to next visit, if persistently >140/90 or new symptoms notify office sooner  Follow-up 4 weeks, BMET chemistry for Cr K follow up        Relevant Medications   valsartan (DIOVAN) 160 MG tablet   Other Relevant Orders   BASIC METABOLIC PANEL WITH GFR   Other Visit Diagnoses     Encounter to establish care with new doctor       Bilateral lower extremity edema       Hypokalemia           Followed by Dr ([facility]) for perianal abscess now mostly healed, has had some recent revision and evaluation, seen earlier today, not infected but still healing.    Meds ordered this encounter  Medications   valsartan (DIOVAN) 160 MG tablet    Sig: Take 1 tablet (160 mg total) by mouth daily.    Dispense:  30 tablet    Refill:  1      Follow up plan: Return in about 4 weeks (around 04/04/2021) for 4 week non fasting lab only then 2-3 days later Virtual Telephone Visit BP.  Aleen Campi, DO [facility] 03/07/2021, 3:35 PM

## 2021-03-07 NOTE — Patient Instructions (Addendum)
Thank you for coming to the office today.  Goal BP < 140 / 90  Stop Amlodipine due to swelling  Start Valsartan 160mg  daily  Check blood in 4 weeks.  Please schedule a Follow-up Appointment to: Return in about 4 weeks (around 04/04/2021) for 4 week non fasting lab only then 2-3 days later Virtual Telephone Visit BP.  If you have any other questions or concerns, please feel free to call the office or send a message through MyChart. You may also schedule an earlier appointment if necessary.  Additionally, you may be receiving a survey about your experience at our office within a few days to 1 week by e-mail or mail. We value your feedback.  04/06/2021, DO [facility], [facility]

## 2021-03-07 NOTE — Patient Instructions (Addendum)
If you have any concerns or questions, please feel free to call our office. See follow up appointment below.     Anorectal Abscess An abscess is an infected area that contains a collection of pus. An anorectal abscess is an abscess that is near the opening of the anus or around the rectum. Without treatment, an anorectal abscess can become larger and cause other problems, such as a more serious body-wide infection or pain, especiallyduring bowel movements. What are the causes? This condition is caused by plugged glands or an infection in one of these areas: The anus. The area between the anus and the scrotum in males or between the anus and the vagina in females (perineum). What increases the risk? The following factors may make you more likely to develop this condition: Diabetes or inflammatory bowel disease. Having a body defense system (immune system) that is weak. Engaging in anal sex. Having a sexually transmitted infection (STI). Certain kinds of cancer, such as rectal carcinoma, leukemia, or lymphoma. What are the signs or symptoms? The main symptom of this condition is pain. The pain may be a throbbing pain that gets worse during bowel movements. Other symptoms include: Swelling and redness in the area of the abscess. The redness may go beyond the abscess and appear as a red streak on the skin. A visible, painful lump, or a lump that can be felt when touched. Bleeding or pus-like discharge from the area. Fever. General weakness. Constipation. Diarrhea. How is this diagnosed? This condition is diagnosed based on your medical history and a physical exam of the affected area. This may involve examining the rectal area with a gloved hand (digital rectal exam). Sometimes, the health care provider needs to look into the rectum using a probe, scope, or imaging test. For women, it may require a careful vaginal exam. How is this treated? Treatment for this condition may  include: Incision and drainage surgery. This involves making an incision over the abscess to drain the pus. Medicines, including antibiotic medicine, pain medicine, stool softeners, or laxatives. Follow these instructions at home: Medicines Take over-the-counter and prescription medicines only as told by your health care provider. If you were prescribed an antibiotic medicine, use it as told by your health care provider. Do not stop using the antibiotic even if you start to feel better. Do not drive or use heavy machinery while taking prescription pain medicine. Wound care  If gauze was used in the abscess, follow instructions from your health care provider about removing or changing the gauze. It can usually be removed in 2-3 days. Wash your hands with soap and water before you remove or change your gauze. If soap and water are not available, use hand sanitizer. If one or more drains were placed in the abscess cavity, be careful not to pull at them. Your health care provider will tell you how long they need to remain in place. Check your incision area every day for signs of infection. Check for: More redness, swelling, or pain. More fluid or blood. Warmth. Pus or a bad smell.  Managing pain, stiffness, and swelling  Take a sitz bath 3-4 times a day and after bowel movements. This will help reduce pain and swelling. To relieve pain, try sitting: On a heating pad with the setting on low. On an inflatable donut-shaped cushion. If directed, put ice on the affected area: Put ice in a plastic bag. Place a towel between your skin and the bag. Leave the ice on for 20  minutes, 2-3 times a day.  General instructions Follow any diet instructions given by your health care provider. Keep all follow-up visits as told by your health care provider. This is important. Contact a health care provider if you have: Bleeding from your incision. Pain, swelling, or redness that does not improve or gets  worse. Trouble passing stool or urine. Symptoms that return after treatment. Get help right away if you: Have problems moving or using your legs. Have severe or increasing pain. Have swelling in the affected area that suddenly gets worse. Have a large increase in bleeding or passing of pus. Develop chills or a fever. Summary An anorectal abscess is an abscess that is near the opening of the anus or around the rectum. An abscess is an infected area that contains a collection of pus. The main symptom of this condition is pain. It may be a throbbing pain that gets worse during bowel movements. Treatment for an anorectal abscess may include surgery to drain the pus from the abscess. Medicines and sitz baths may also be a part of your treatment plan. This information is not intended to replace advice given to you by your health care provider. Make sure you discuss any questions you have with your healthcare provider. Document Revised: 09/30/2017 Document Reviewed: 09/30/2017 Elsevier Patient Education  2022 [employer].

## 2021-03-07 NOTE — Assessment & Plan Note (Signed)
Elevated BP - Home BP readings still elevated  W/ Hypokalemia Fam history HTN   Plan:  1. Stop Amlodipine 5mg  due to swelling 2. START Valsartan ARB 160mg  daily new rx - also has hyopK this may help. 3. Encourage improved lifestyle - low sodium diet, regular exercise 4. Start monitor BP outside office, bring readings to next visit, if persistently >140/90 or new symptoms notify office sooner  Follow-up 4 weeks, BMET chemistry for Cr K follow up

## 2021-03-17 ENCOUNTER — Encounter: Payer: Self-pay | Admitting: Surgery

## 2021-03-17 ENCOUNTER — Other Ambulatory Visit: Payer: Self-pay

## 2021-03-17 ENCOUNTER — Ambulatory Visit (INDEPENDENT_AMBULATORY_CARE_PROVIDER_SITE_OTHER): Payer: BC Managed Care – PPO | Admitting: Surgery

## 2021-03-17 VITALS — BP 145/99 | HR 76 | Temp 98.4°F | Ht 71.5 in | Wt 241.0 lb

## 2021-03-17 DIAGNOSIS — Z8719 Personal history of other diseases of the digestive system: Secondary | ICD-10-CM

## 2021-03-17 DIAGNOSIS — Z9889 Other specified postprocedural states: Secondary | ICD-10-CM | POA: Diagnosis not present

## 2021-03-17 DIAGNOSIS — L7682 Other postprocedural complications of skin and subcutaneous tissue: Secondary | ICD-10-CM | POA: Diagnosis not present

## 2021-03-17 DIAGNOSIS — K610 Anal abscess: Secondary | ICD-10-CM

## 2021-03-17 NOTE — H&P (View-Only) (Signed)
03/17/2021  History of Present Illness: Daniel Steele is a 60 y.o. male s/p I&D of left perianal and ischiorectal abscess on 01/13/21, requiring multiple penrose drains.  Drains have been removed, but the patient presents due to persistent intermittent drainage from one of the I&D sites, which is located about 2.5 cm distal to the anal verge on the left side.  He just had another episode of drainage over the weekend, which he describes and fluid with some blood in it, not particularly thick or purulent.  Denies any pain in the perianal area, fevers, or chills.    Past Medical History: Past Medical History:  Diagnosis Date   Family history of malignant hyperthermia    father and cousin   Hypertension      Past Surgical History: Past Surgical History:  Procedure Laterality Date   arm surgery     CHOLECYSTECTOMY  2011   INCISION AND DRAINAGE ABSCESS N/A 01/13/2021   Procedure: INCISION AND DRAINAGE ABSCESS perirectal;  Surgeon: Teshia Mahone, MD;  Location: [facility] ORS;  Service: General;  Laterality: N/A;    Home Medications: Prior to Admission medications   Medication Sig Start Date End Date Taking? Authorizing Provider  valsartan (DIOVAN) 160 MG tablet Take 1 tablet (160 mg total) by mouth daily. 03/07/21  Yes Karamalegos, Alexander J, DO    Allergies: No Known Allergies  Review of Systems: Review of Systems  Constitutional:  Negative for chills and fever.  Respiratory:  Negative for shortness of breath.   Cardiovascular:  Negative for chest pain.  Gastrointestinal:  Negative for nausea and vomiting.  Skin:  Negative for rash.   Physical Exam BP (!) 145/99   Pulse 76   Temp 98.4 F (36.9 C)   Ht 5' 11.5" (1.816 m)   Wt 241 lb (109.3 kg)   SpO2 98%   BMI 33.14 kg/m  CONSTITUTIONAL: No acute distress, well nourished. HEENT:  Normocephalic, atraumatic, extraocular motion intact. RESPIRATORY:  Normal respiratory effort without pathologic use of accessory  muscles. CARDIOVASCULAR: Regular rhythm and rate. SKIN:  The left medial buttocks shows no erythema or induration.  The most superior wound at the top of the gluteal cleft is almost healed, measuring now about 2 mm in size.  There's no drainage from it.  The left perianal wound is also small, about 2 mm, but has some drainage with manipulation of the tissue, which is thin seropurulent.  No pain on palpation. NEUROLOGIC:  Motor and sensation is grossly normal.  Cranial nerves are grossly intact. PSYCH:  Alert and oriented to person, place and time. Affect is normal.  Assessment and Plan: This is a 60 y.o. male s/p I&D of left perianal and ischiorectal abscess.   --Patient continues to have an area of drainage from the same wound in the left perianal area.  My concern would be with either he has a small pocket of low grade infection which needs to be better drained, or he has a possible fistula.  Discussed with him that I think it's best to proceed to the OR for exam under anesthesia and possible I&D or fistula plug depending on the findings.  Discussed with him the risks of bleeding, infection, injury to surrounding structures, and he's willing to proceed.  As he is not have any systemic or really any regional symptoms except for the drainage, do not have to do this urgently.  We'll schedule him for 03/27/21.  All questions have been answered.  Face-to-face time spent with the patient   and care providers was 25 minutes, with more than 50% of the time spent counseling, educating, and coordinating care of the patient.     Howie Ill, MD [facility]

## 2021-03-17 NOTE — Progress Notes (Signed)
03/17/2021  History of Present Illness: Daniel Steele is a 60 y.o. male s/p I&D of left perianal and ischiorectal abscess on 01/13/21, requiring multiple penrose drains.  Drains have been removed, but the patient presents due to persistent intermittent drainage from one of the I&D sites, which is located about 2.5 cm distal to the anal verge on the left side.  He just had another episode of drainage over the weekend, which he describes and fluid with some blood in it, not particularly thick or purulent.  Denies any pain in the perianal area, fevers, or chills.    Past Medical History: Past Medical History:  Diagnosis Date   Family history of malignant hyperthermia    father and cousin   Hypertension      Past Surgical History: Past Surgical History:  Procedure Laterality Date   arm surgery     CHOLECYSTECTOMY  2011   INCISION AND DRAINAGE ABSCESS N/A 01/13/2021   Procedure: INCISION AND DRAINAGE ABSCESS perirectal;  Surgeon: Henrene Dodge, MD;  Location: [facility] ORS;  Service: General;  Laterality: N/A;    Home Medications: Prior to Admission medications   Medication Sig Start Date End Date Taking? Authorizing Provider  valsartan (DIOVAN) 160 MG tablet Take 1 tablet (160 mg total) by mouth daily. 03/07/21  Yes Karamalegos, Netta Neat, DO    Allergies: No Known Allergies  Review of Systems: Review of Systems  Constitutional:  Negative for chills and fever.  Respiratory:  Negative for shortness of breath.   Cardiovascular:  Negative for chest pain.  Gastrointestinal:  Negative for nausea and vomiting.  Skin:  Negative for rash.   Physical Exam BP (!) 145/99   Pulse 76   Temp 98.4 F (36.9 C)   Ht 5' 11.5" (1.816 m)   Wt 241 lb (109.3 kg)   SpO2 98%   BMI 33.14 kg/m  CONSTITUTIONAL: No acute distress, well nourished. HEENT:  Normocephalic, atraumatic, extraocular motion intact. RESPIRATORY:  Normal respiratory effort without pathologic use of accessory  muscles. CARDIOVASCULAR: Regular rhythm and rate. SKIN:  The left medial buttocks shows no erythema or induration.  The most superior wound at the top of the gluteal cleft is almost healed, measuring now about 2 mm in size.  There's no drainage from it.  The left perianal wound is also small, about 2 mm, but has some drainage with manipulation of the tissue, which is thin seropurulent.  No pain on palpation. NEUROLOGIC:  Motor and sensation is grossly normal.  Cranial nerves are grossly intact. PSYCH:  Alert and oriented to person, place and time. Affect is normal.  Assessment and Plan: This is a 60 y.o. male s/p I&D of left perianal and ischiorectal abscess.   --Patient continues to have an area of drainage from the same wound in the left perianal area.  My concern would be with either he has a small pocket of low grade infection which needs to be better drained, or he has a possible fistula.  Discussed with him that I think it's best to proceed to the OR for exam under anesthesia and possible I&D or fistula plug depending on the findings.  Discussed with him the risks of bleeding, infection, injury to surrounding structures, and he's willing to proceed.  As he is not have any systemic or really any regional symptoms except for the drainage, do not have to do this urgently.  We'll schedule him for 03/27/21.  All questions have been answered.  Face-to-face time spent with the patient  and care providers was 25 minutes, with more than 50% of the time spent counseling, educating, and coordinating care of the patient.     Howie Ill, MD [facility]

## 2021-03-17 NOTE — Progress Notes (Deleted)
03/17/2021  HPI: Daniel Steele is a 60 y.o. male s/p I&D of left perianal and ischiorectal abscess on 01/13/21, requiring multiple penrose drains.  Drains have been removed, but the patient presents due to persistent intermittent drainage from one of the I&D sites, which is located about 2.5 cm distal to the anal verge on the left side.  He just had another episode of drainage over the weekend, which he describes and fluid with some blood in it, not particularly thick or purulent.  Denies any pain in the perianal area, fevers, or chills.  ROS:   Vital signs: BP (!) 145/99   Pulse 76   Temp 98.4 F (36.9 C)   Ht 5' 11.5" (1.816 m)   Wt 241 lb (109.3 kg)   SpO2 98%   BMI 33.14 kg/m    Physical Exam: Constitutional:  No acute distress, well nourished. Pulmonary:  Normal work of breathing without any use of accessory muscles. Cardiovascular:  Regular rhythm and rate Skin:  The left medial buttocks shows no erythema or induration.  The most superior wound at the top of the gluteal cleft is almost healed, measuring now about 2 mm in size.  There's no drainage from it.  The left perianal wound is also small, about 2 mm, but has some drainage with manipulation of the tissue, which is thin seropurulent.  No pain on palpation. Musculoskeletal:  No peripheral edema, normal gait. Neuro:  motor and sensory grossly intact.    Assessment/Plan: This is a 60 y.o. male s/p I&D of left perianal and ischiorectal abscess.  --Patient continues to have an area of drainage from the same wound in the left perianal area.  My concern would be with either he has a small pocket of low grade infection which needs to be better drained, or he has a possible fistula.  Discussed with him that I think it's best to proceed to the OR for exam under anesthesia and possible I&D or fistula plug depending on the findings.  Discussed with him the risks of bleeding, infection, injury to surrounding structures, and he's willing  to proceed.  As he is not have any systemic or really any regional symptoms except for the drainage, do not have to do this urgently.  We'll schedule him for 03/27/21.  All questions have been answered.   Howie Ill, MD [facility]

## 2021-03-17 NOTE — Patient Instructions (Signed)
We have spoken about having an exam under anesthesia to repair your fistula. This will be done at [facility] with Dr. Aleen Campi.   Please see the (blue)pre-care form that you have been given today. Our surgery scheduler will call you to look at surgery dates and go over information.   If you have any questions, please call our office.   Anal Fistula  An anal fistula is a hole that develops between the bowel and the skin near the anus. The anus allows stool (feces) to leave the body. The anus has many tiny glands that make lubricating fluid. Sometimes, these glands become plugged and infected. This can cause a fluid-filled pocket (abscess) to form. An anal fistula often occurs when an abscess becomes infected andthen develops into a hole between the bowel and the skin. What are the causes? In most cases, an anal fistula is caused by a past or current buildup of pus around the anus (anal abscess). Other causes include: A complication of surgery. Injury to the rectum or the area around it. Using high-energy beams (radiation) to treat the area around the rectum. What increases the risk? You are more likely to develop this condition if you have certain medical conditions or diseases, including: Chronic inflammatory bowel disease, such as Crohn's disease or ulcerative colitis. Colon cancer or rectal cancer. Diverticular disease, such as diverticulitis. A sexually transmitted infection, or STI, such as gonorrhea, chlamydia, or syphilis. An infection that is caused by HIV. What are the signs or symptoms? Symptoms of this condition include: Throbbing or constant pain that may be worse while you are sitting. Swelling or irritation around the anus. Pus or blood from an opening near the anus. Pain when passing stool. Fever or chills. How is this diagnosed? This condition is diagnosed based on: A physical exam. This may include: An exam to find the external opening of the fistula. An exam  with a probe or scope to help locate the internal opening of the fistula. An exam of the rectum with a gloved hand (digital rectal exam). Imaging tests that use dye to find the exact location and path of the fistula. Tests may include: X-rays. Ultrasound. CT scan. MRI. Other tests to find the cause of the anal fistula. How is this treated? This condition is most commonly treated with surgery. The type of surgery that is used will depend on where the fistula is located and how complex the fistula is. Surgery may include: A fistulotomy. The whole fistula is opened up, and the contents are drained to promote healing. Seton placement. A silk string (seton) is placed into the fistula during a fistulotomy. This helps to drain any infection and promote healing. Advancement flap procedure. Tissue is removed from your rectum or the skin around the anus and attached to the opening of the fistula. Bioprosthetic plug. A cone-shaped plug is made from your tissue and is used to block the opening of the fistula. Some anal fistulas do not require surgery. A nonsurgical treatment option involves injecting a fibrin glue to seal the fistula. You also may beprescribed an antibiotic medicine to treat any infection. Follow these instructions at home: Medicines Take over-the-counter and prescription medicines only as told by your health care provider. If you were prescribed an antibiotic medicine, take it as told by your health care provider. Do not stop taking the antibiotic even if you start to feel better. Use a stool softener or a laxative if told to do so by your health care provider.  General instructions  Eat a high-fiber diet as told by your health care provider. This can help to prevent constipation. Drink enough fluid to keep your urine pale yellow. Take a warm sitz bath for 15-20 minutes, 3-4 times per day, or as told by your health care provider. Sitz baths can ease your pain and discomfort and help with  healing. Follow good hygiene to keep the anal area as clean and dry as possible. Use wet toilet paper or a moist towelette after each bowel movement. Keep all follow-up visits as told by your health care provider. This is important.  Contact a health care provider if you have: Increased pain that is not controlled with medicines. New redness or swelling around the anal area. New fluid, blood, or pus coming from the anal area. Tenderness or warmth around the anal area. Get help right away if you have: A fever. Severe pain. Chills or diarrhea. Severe problems urinating or having a bowel movement. Summary An anal fistula is a hole that develops between the bowel and the skin near the anus. This condition is most often caused by a buildup of pus around the anus (anal abscess). Other causes include a complication of surgery, an injury to the rectum, or the use of radiation to treat the rectal area. This condition is most commonly treated with surgery. Follow your health care provider's instructions about taking medicines, eating and drinking, or taking sitz baths. Call your health care provider if you have more pain, swelling, or blood. Get help right away if you have fever, severe pain, or problems passing urine or stool. This information is not intended to replace advice given to you by your health care provider. Make sure you discuss any questions you have with your healthcare provider. Document Revised: 01/07/2018 Document Reviewed: 01/07/2018 Elsevier Patient Education  2022 [employer].

## 2021-03-18 ENCOUNTER — Telehealth: Payer: Self-pay | Admitting: Surgery

## 2021-03-18 NOTE — Telephone Encounter (Signed)
Patient has been advised of Pre-Admission date/time, COVID Testing date and Surgery date.  Surgery Date: 03/27/21 Preadmission Testing Date: 03/24/21 (phone 8a-1p) Covid Testing Date: Not needed.     Patient has been made aware to call [phone], between 1-3:00pm the day before surgery, to find out what time to arrive for surgery.

## 2021-03-24 ENCOUNTER — Other Ambulatory Visit
Admission: RE | Admit: 2021-03-24 | Discharge: 2021-03-24 | Disposition: A | Discharge status: Home / Self Care | Payer: BC Managed Care – PPO | Source: Ambulatory Visit | Attending: Surgery | Admitting: Surgery

## 2021-03-24 ENCOUNTER — Other Ambulatory Visit: Payer: Self-pay

## 2021-03-24 HISTORY — DX: Family history of other specified conditions: Z84.89

## 2021-03-24 HISTORY — DX: Anal abscess: K61.0

## 2021-03-24 NOTE — Patient Instructions (Addendum)
Your procedure is scheduled on: Thursday, July 21 Report to the Registration Desk on the 1st floor of the [facility] To find out your arrival time, please call [phone] between 1PM - 3PM on: Wednesday, July 20  REMEMBER: Instructions that are not followed completely may result in serious medical risk, up to and including death; or upon the discretion of your surgeon and anesthesiologist your surgery may need to be rescheduled.  Do not eat food after midnight the night before surgery.  No gum chewing, lozengers or hard candies.  You may however, drink CLEAR liquids up to 2 hours before you are scheduled to arrive for your surgery. Do not drink anything within 2 hours of your scheduled arrival time.  Clear liquids include: - water  - apple juice without pulp - gatorade (not RED, PURPLE, OR BLUE) - black coffee or tea (Do NOT add milk or creamers to the coffee or tea) Do NOT drink anything that is not on this list.  DO NOT TAKE ANY MEDICATIONS THE MORNING OF SURGERY   One week prior to surgery: Stop Anti-inflammatories (NSAIDS) such as Advil, Aleve, Ibuprofen, Motrin, Naproxen, Naprosyn and Aspirin based products such as Excedrin, Goodys Powder, BC Powder. Stop ANY OVER THE COUNTER supplements until after surgery. You may however, continue to take Tylenol if needed for pain up until the day of surgery.  No Alcohol for 24 hours before or after surgery.  No Smoking including e-cigarettes for 24 hours prior to surgery.  No chewable tobacco products for at least 6 hours prior to surgery.  No nicotine patches on the day of surgery.  Do not use any "recreational" drugs for at least a week prior to your surgery.  Please be advised that the combination of cocaine and anesthesia may have negative outcomes, up to and including death. If you test positive for cocaine, your surgery will be cancelled.  On the morning of surgery brush your teeth with toothpaste and water, you may rinse  your mouth with mouthwash if you wish. Do not swallow any toothpaste or mouthwash.  Do not wear jewelry, make-up, hairpins, clips or nail polish.  Do not wear lotions, powders, or perfumes.   Do not shave body from the neck down 48 hours prior to surgery just in case you cut yourself which could leave a site for infection.   Contact lenses, hearing aids and dentures may not be worn into surgery.  Do not bring valuables to the hospital. [facility] is not responsible for any missing/lost belongings or valuables.   Notify your doctor if there is any change in your medical condition (cold, fever, infection).  Wear comfortable clothing (specific to your surgery type) to the hospital.  After surgery, you can help prevent lung complications by doing breathing exercises.  Take deep breaths and cough every 1-2 hours. Your doctor may order a device called an Incentive Spirometer to help you take deep breaths.  If you are being discharged the day of surgery, you will not be allowed to drive home. You will need a responsible adult (18 years or older) to drive you home and stay with you that night.   If you are taking public transportation, you will need to have a responsible adult (18 years or older) with you. Please confirm with your physician that it is acceptable to use public transportation.   Please call the Pre-admissions Testing Dept. at [phone] if you have any questions about these instructions.  Surgery Visitation Policy:  Patients undergoing a surgery or procedure may have one family member or support person with them as long as that person is not COVID-19 positive or experiencing its symptoms.  That person may remain in the waiting area during the procedure.

## 2021-03-25 ENCOUNTER — Encounter: Payer: Self-pay | Admitting: Urgent Care

## 2021-03-25 ENCOUNTER — Encounter
Admission: RE | Admit: 2021-03-25 | Discharge: 2021-03-25 | Disposition: A | Discharge status: Home / Self Care | Payer: BC Managed Care – PPO | Source: Ambulatory Visit | Attending: Surgery | Admitting: Surgery

## 2021-03-25 DIAGNOSIS — Z79899 Other long term (current) drug therapy: Secondary | ICD-10-CM | POA: Diagnosis not present

## 2021-03-25 DIAGNOSIS — K610 Anal abscess: Secondary | ICD-10-CM | POA: Diagnosis not present

## 2021-03-25 DIAGNOSIS — Z01812 Encounter for preprocedural laboratory examination: Secondary | ICD-10-CM | POA: Insufficient documentation

## 2021-03-25 DIAGNOSIS — K648 Other hemorrhoids: Secondary | ICD-10-CM | POA: Diagnosis not present

## 2021-03-25 LAB — POTASSIUM: Potassium: 2.9 mmol/L — ABNORMAL LOW (ref 3.5–5.1)

## 2021-03-26 ENCOUNTER — Other Ambulatory Visit: Payer: Self-pay | Admitting: Surgery

## 2021-03-26 MED ORDER — BUPIVACAINE LIPOSOME 1.3 % IJ SUSP: 20.0000 mL | Freq: Once | INTRAMUSCULAR | Status: DC

## 2021-03-26 MED ORDER — FAMOTIDINE 20 MG PO TABS: 20.0000 mg | ORAL_TABLET | Freq: Once | ORAL | Status: AC

## 2021-03-26 MED ORDER — ORAL CARE MOUTH RINSE: 15.0000 mL | Freq: Once | OROMUCOSAL | Status: AC

## 2021-03-26 MED ORDER — SODIUM CHLORIDE 0.9 % IV SOLN
2.0000 g | INTRAVENOUS | Status: AC
  Administered 2021-03-27: 2 g via INTRAVENOUS
  Filled 2021-03-26: qty 2

## 2021-03-26 MED ORDER — ACETAMINOPHEN 500 MG PO TABS: 1000.0000 mg | ORAL_TABLET | ORAL | Status: AC

## 2021-03-26 MED ORDER — SODIUM CHLORIDE 0.9 % IV SOLN: INTRAVENOUS | Status: DC

## 2021-03-26 MED ORDER — METRONIDAZOLE 500 MG/100ML IV SOLN
500.0000 mg | INTRAVENOUS | Status: AC
  Administered 2021-03-27: 500 mg via INTRAVENOUS
  Filled 2021-03-26: qty 100

## 2021-03-26 MED ORDER — GABAPENTIN 300 MG PO CAPS: 300.0000 mg | ORAL_CAPSULE | ORAL | Status: AC

## 2021-03-26 MED ORDER — CHLORHEXIDINE GLUCONATE 0.12 % MT SOLN: 15.0000 mL | Freq: Once | OROMUCOSAL | Status: AC

## 2021-03-26 MED ORDER — CHLORHEXIDINE GLUCONATE CLOTH 2 % EX PADS: 6.0000 | MEDICATED_PAD | Freq: Once | CUTANEOUS | Status: DC

## 2021-03-26 MED ORDER — POTASSIUM CHLORIDE CRYS ER 20 MEQ PO TBCR: 40.0000 meq | EXTENDED_RELEASE_TABLET | Freq: Four times a day (QID) | ORAL | 0 refills | Status: DC

## 2021-03-26 NOTE — Progress Notes (Signed)
03/26/21  Potassium level is low on preop labs.  Sending prescription to pharmacy.  Notified patient via MyChart.  Henrene Dodge, MD

## 2021-03-26 NOTE — Progress Notes (Signed)
  [facility] Perioperative Services: Pre-Admission/Anesthesia Testing  Abnormal Lab Documentation   Date: 03/26/21  Name: Daniel Steele MRN:   [id number]  Re: Abnormal labs noted during PAT appointment   Provider(s) Notified: Henrene Dodge, MD (already aware)   ABNORMAL LAB VALUE(S): Lab Results  Component Value Date   K 2.9 (L) 03/25/2021   Notes:  Patient is scheduled for an EUA FOR POSSIBLE I&D OF PERIRECTAL ABSCESS AND FISTULA PLUG on 03/27/2021.  Review of his medication reconciliation does not indicate that patient is on any type of daily diuretic therapies.  Of note, lab resulted late yesterday afternoon.  Provider aware of results and has called in patient a prescription for oral K+ supplementation.  PAT APP placed order to have K+ rechecked tomorrow prior to patient's procedure/anesthetic course to ensure optimization.    Quentin Mulling, MSN, APRN, FNP-C, CEN [facility]  Peri-operative Services Nurse Practitioner Phone: [phone] Fax: [phone] 03/26/21 12:15 PM

## 2021-03-27 ENCOUNTER — Encounter
Admission: RE | Disposition: A | Discharge status: Home / Self Care | Payer: Self-pay | Source: Home / Self Care | Attending: Surgery

## 2021-03-27 ENCOUNTER — Other Ambulatory Visit: Payer: BC Managed Care – PPO

## 2021-03-27 ENCOUNTER — Ambulatory Visit
Admission: RE | Admit: 2021-03-27 | Discharge: 2021-03-27 | Disposition: A | Discharge status: Home / Self Care | Payer: BC Managed Care – PPO | Attending: Surgery | Admitting: Surgery

## 2021-03-27 ENCOUNTER — Encounter: Payer: Self-pay | Admitting: Surgery

## 2021-03-27 ENCOUNTER — Other Ambulatory Visit: Payer: Self-pay

## 2021-03-27 ENCOUNTER — Ambulatory Visit: Payer: BC Managed Care – PPO | Admitting: Urgent Care

## 2021-03-27 DIAGNOSIS — K610 Anal abscess: Secondary | ICD-10-CM | POA: Diagnosis not present

## 2021-03-27 DIAGNOSIS — I10 Essential (primary) hypertension: Secondary | ICD-10-CM

## 2021-03-27 DIAGNOSIS — K648 Other hemorrhoids: Secondary | ICD-10-CM | POA: Diagnosis not present

## 2021-03-27 DIAGNOSIS — Z79899 Other long term (current) drug therapy: Secondary | ICD-10-CM | POA: Insufficient documentation

## 2021-03-27 HISTORY — PX: INCISION AND DRAINAGE ABSCESS: SHX5864

## 2021-03-27 HISTORY — DX: Malignant hyperthermia due to anesthesia, initial encounter: T88.3XXA

## 2021-03-27 LAB — BASIC METABOLIC PANEL WITH GFR
BUN: 11 mg/dL (ref 7–25)
CO2: 31 mmol/L (ref 20–32)
Calcium: 9 mg/dL (ref 8.6–10.3)
Chloride: 104 mmol/L (ref 98–110)
Creat: 1.04 mg/dL (ref 0.70–1.30)
Glucose, Bld: 90 mg/dL (ref 65–139)
Potassium: 3.8 mmol/L (ref 3.5–5.3)
Sodium: 142 mmol/L (ref 135–146)
eGFR: 83 mL/min/{1.73_m2} (ref >=60)

## 2021-03-27 LAB — POCT I-STAT, CHEM 8
BUN: 9 mg/dL (ref 6–20)
Calcium, Ion: 1.12 mmol/L — ABNORMAL LOW (ref 1.15–1.40)
Chloride: 103 mmol/L (ref 98–111)
Creatinine, Ser: 1 mg/dL (ref 0.61–1.24)
Glucose, Bld: 98 mg/dL (ref 70–99)
HCT: 36 % — ABNORMAL LOW (ref 39.0–52.0)
Hemoglobin: 12.2 g/dL — ABNORMAL LOW (ref 13.0–17.0)
Potassium: 3 mmol/L — ABNORMAL LOW (ref 3.5–5.1)
Sodium: 142 mmol/L (ref 135–145)
TCO2: 29 mmol/L (ref 22–32)

## 2021-03-27 SURGERY — EXAM UNDER ANESTHESIA
Anesthesia: General

## 2021-03-27 MED ORDER — FENTANYL CITRATE (PF) 100 MCG/2ML IJ SOLN
INTRAMUSCULAR | Status: AC
  Filled 2021-03-27: qty 2

## 2021-03-27 MED ORDER — OXYCODONE HCL 5 MG PO TABS: 5.0000 mg | ORAL_TABLET | ORAL | 0 refills | Status: DC | PRN

## 2021-03-27 MED ORDER — CHLORHEXIDINE GLUCONATE 0.12 % MT SOLN
OROMUCOSAL | Status: AC
  Administered 2021-03-27: 15 mL via OROMUCOSAL
  Filled 2021-03-27: qty 15

## 2021-03-27 MED ORDER — GABAPENTIN 300 MG PO CAPS
ORAL_CAPSULE | ORAL | Status: AC
  Administered 2021-03-27: 300 mg via ORAL
  Filled 2021-03-27: qty 1

## 2021-03-27 MED ORDER — MIDAZOLAM HCL 2 MG/2ML IJ SOLN
INTRAMUSCULAR | Status: DC | PRN
  Administered 2021-03-27: 2 mg via INTRAVENOUS

## 2021-03-27 MED ORDER — PROPOFOL 10 MG/ML IV BOLUS
INTRAVENOUS | Status: AC
  Filled 2021-03-27: qty 20

## 2021-03-27 MED ORDER — BUPIVACAINE-EPINEPHRINE (PF) 0.5% -1:200000 IJ SOLN
INTRAMUSCULAR | Status: AC
  Filled 2021-03-27: qty 30

## 2021-03-27 MED ORDER — LIDOCAINE HCL (PF) 2 % IJ SOLN
INTRAMUSCULAR | Status: AC
  Filled 2021-03-27: qty 5

## 2021-03-27 MED ORDER — PROPOFOL 1000 MG/100ML IV EMUL
INTRAVENOUS | Status: AC
  Filled 2021-03-27: qty 100

## 2021-03-27 MED ORDER — IRBESARTAN 150 MG PO TABS
150.0000 mg | ORAL_TABLET | Freq: Every day | ORAL | Status: DC
  Filled 2021-03-27: qty 1

## 2021-03-27 MED ORDER — PROPOFOL 500 MG/50ML IV EMUL
INTRAVENOUS | Status: DC | PRN
Start: 2021-03-27 — End: 2021-03-27
  Administered 2021-03-27: 150 ug/kg/min via INTRAVENOUS

## 2021-03-27 MED ORDER — MIDAZOLAM HCL 2 MG/2ML IJ SOLN
INTRAMUSCULAR | Status: AC
  Filled 2021-03-27: qty 2

## 2021-03-27 MED ORDER — BUPIVACAINE LIPOSOME 1.3 % IJ SUSP
INTRAMUSCULAR | Status: DC | PRN
  Administered 2021-03-27: 50 mL

## 2021-03-27 MED ORDER — BUPIVACAINE LIPOSOME 1.3 % IJ SUSP
INTRAMUSCULAR | Status: AC
  Filled 2021-03-27: qty 20

## 2021-03-27 MED ORDER — FENTANYL CITRATE (PF) 100 MCG/2ML IJ SOLN
INTRAMUSCULAR | Status: DC | PRN
  Administered 2021-03-27: 100 ug via INTRAVENOUS

## 2021-03-27 MED ORDER — POTASSIUM CHLORIDE CRYS ER 20 MEQ PO TBCR: 20.0000 meq | EXTENDED_RELEASE_TABLET | Freq: Every day | ORAL | 1 refills | Status: DC

## 2021-03-27 MED ORDER — FAMOTIDINE 20 MG PO TABS
ORAL_TABLET | ORAL | Status: AC
  Administered 2021-03-27: 20 mg via ORAL
  Filled 2021-03-27: qty 1

## 2021-03-27 MED ORDER — LIDOCAINE HCL (CARDIAC) PF 100 MG/5ML IV SOSY
PREFILLED_SYRINGE | INTRAVENOUS | Status: DC | PRN
  Administered 2021-03-27: 100 mg via INTRAVENOUS

## 2021-03-27 MED ORDER — ACETAMINOPHEN 500 MG PO TABS: 1000.0000 mg | ORAL_TABLET | Freq: Four times a day (QID) | ORAL | Status: AC | PRN

## 2021-03-27 MED ORDER — HYDROGEN PEROXIDE 3 % EX SOLN
CUTANEOUS | Status: DC | PRN
  Administered 2021-03-27: 1

## 2021-03-27 MED ORDER — PROPOFOL 10 MG/ML IV BOLUS
INTRAVENOUS | Status: DC | PRN
  Administered 2021-03-27: 200 mg via INTRAVENOUS

## 2021-03-27 MED ORDER — ONDANSETRON HCL 4 MG/2ML IJ SOLN
INTRAMUSCULAR | Status: DC | PRN
Start: 2021-03-27 — End: 2021-03-27
  Administered 2021-03-27: 4 mg via INTRAVENOUS

## 2021-03-27 MED ORDER — 0.9 % SODIUM CHLORIDE (POUR BTL) OPTIME
TOPICAL | Status: DC | PRN
  Administered 2021-03-27: 300 mL

## 2021-03-27 MED ORDER — DAKINS (1/4 STRENGTH) 0.125 % EX SOLN
CUTANEOUS | Status: AC
  Filled 2021-03-27: qty 473

## 2021-03-27 MED ORDER — ACETAMINOPHEN 500 MG PO TABS
ORAL_TABLET | ORAL | Status: AC
  Administered 2021-03-27: 1000 mg via ORAL
  Filled 2021-03-27: qty 2

## 2021-03-27 MED ORDER — IBUPROFEN 800 MG PO TABS: 800.0000 mg | ORAL_TABLET | Freq: Three times a day (TID) | ORAL | 1 refills | Status: AC | PRN

## 2021-03-27 SURGICAL SUPPLY — 39 items
APL PRP STRL LF DISP 70% ISPRP (MISCELLANEOUS)
BNDG GAUZE ELAST 4 BULKY (GAUZE/BANDAGES/DRESSINGS) IMPLANT
CANISTER SUCT 1200ML W/VALVE (MISCELLANEOUS) IMPLANT
CHLORAPREP W/TINT 26 (MISCELLANEOUS) IMPLANT
CNTNR SPEC 2.5X3XGRAD LEK (MISCELLANEOUS)
CONT SPEC 4OZ STER OR WHT (MISCELLANEOUS)
CONT SPEC 4OZ STRL OR WHT (MISCELLANEOUS)
CONTAINER SPEC 2.5X3XGRAD LEK (MISCELLANEOUS) IMPLANT
DRAIN PENROSE 1/4X12 LTX STRL (WOUND CARE) ×2 IMPLANT
DRAIN PENROSE 12X.25 LTX STRL (MISCELLANEOUS) IMPLANT
DRAPE LAPAROTOMY 77X122 PED (DRAPES) ×2 IMPLANT
DRSG GAUZE FLUFF 36X18 (GAUZE/BANDAGES/DRESSINGS) IMPLANT
ELECT CAUTERY BLADE TIP 2.5 (TIP) ×2
ELECT REM PT RETURN 9FT ADLT (ELECTROSURGICAL) ×2
ELECTRODE CAUTERY BLDE TIP 2.5 (TIP) ×1 IMPLANT
ELECTRODE REM PT RTRN 9FT ADLT (ELECTROSURGICAL) ×1 IMPLANT
GAUZE 4X4 16PLY ~~LOC~~+RFID DBL (SPONGE) ×2 IMPLANT
GAUZE SPONGE 4X4 12PLY STRL (GAUZE/BANDAGES/DRESSINGS) ×2 IMPLANT
GLOVE SURG SYN 7.0 (GLOVE) ×2 IMPLANT
GLOVE SURG SYN 7.5  E (GLOVE) ×1
GLOVE SURG SYN 7.5 E (GLOVE) ×1 IMPLANT
GOWN STRL REUS W/ TWL LRG LVL3 (GOWN DISPOSABLE) ×2 IMPLANT
GOWN STRL REUS W/TWL LRG LVL3 (GOWN DISPOSABLE) ×4
IV CATH ANGIO 14GX1.88 NO SAFE (IV SOLUTION) ×2 IMPLANT
KIT TURNOVER KIT A (KITS) ×2 IMPLANT
LABEL OR SOLS (LABEL) ×2 IMPLANT
MANIFOLD NEPTUNE II (INSTRUMENTS) ×2 IMPLANT
NEEDLE HYPO 22GX1.5 SAFETY (NEEDLE) ×2 IMPLANT
NS IRRIG 500ML POUR BTL (IV SOLUTION) ×2 IMPLANT
PACK BASIN MINOR ARMC (MISCELLANEOUS) ×2 IMPLANT
PAD ABD DERMACEA PRESS 5X9 (GAUZE/BANDAGES/DRESSINGS) ×2 IMPLANT
SOL PREP PVP 2OZ (MISCELLANEOUS) ×2
SOLUTION PREP PVP 2OZ (MISCELLANEOUS) ×1 IMPLANT
SPONGE T-LAP 18X18 ~~LOC~~+RFID (SPONGE) ×4 IMPLANT
SURGILUBE 2OZ TUBE FLIPTOP (MISCELLANEOUS) ×2 IMPLANT
SUT SILK 0 CT 1 30 (SUTURE) ×2 IMPLANT
SWAB CULTURE AMIES ANAERIB BLU (MISCELLANEOUS) IMPLANT
SYR 20ML LL LF (SYRINGE) ×2 IMPLANT
SYR BULB IRRIG 60ML STRL (SYRINGE) ×2 IMPLANT

## 2021-03-27 NOTE — Transfer of Care (Signed)
Immediate Anesthesia Transfer of Care Note  Patient: Daniel Steele  Procedure(s) Performed: [name] UNDER ANESTHESIA INCISION AND DRAINAGE ABSCESS  Patient Location: PACU  Anesthesia Type:General  Level of Consciousness: sedated  Airway & Oxygen Therapy: Patient Spontanous Breathing and Patient connected to face mask oxygen  Post-op Assessment: Report given to RN and Post -op Vital signs reviewed and stable  Post vital signs: Reviewed and stable  Last Vitals:  Vitals Value Taken Time  BP 122/73   Temp    Pulse 65 03/27/21 1557  Resp 17 03/27/21 1557  SpO2 97 % 03/27/21 1557  Vitals shown include unvalidated device data.  Last Pain:  Vitals:   03/27/21 1228  TempSrc: Temporal  PainSc: 0-No pain         Complications: No notable events documented.

## 2021-03-27 NOTE — Anesthesia Procedure Notes (Signed)
Procedure Name: LMA Insertion Date/Time: 03/27/2021 3:04 PM Performed by: Alanson Puls, RN Pre-anesthesia Checklist: Patient identified, Patient being monitored, Timeout performed, Emergency Drugs available and Suction available Patient Re-evaluated:Patient Re-evaluated prior to induction Oxygen Delivery Method: Circle system utilized Preoxygenation: Pre-oxygenation with 100% oxygen Induction Type: IV induction Ventilation: Mask ventilation without difficulty LMA: LMA inserted LMA Size: 4.0 Tube type: Oral Number of attempts: 1 Placement Confirmation: positive ETCO2 and breath sounds checked- equal and bilateral Tube secured with: Tape Dental Injury: Teeth and Oropharynx as per pre-operative assessment

## 2021-03-27 NOTE — Discharge Instructions (Signed)

## 2021-03-27 NOTE — OR Nursing (Signed)
Dr. Laural Benes aware of starting BP of 194/114.

## 2021-03-27 NOTE — OR Nursing (Signed)
Potassium level today is 3.0. Dr. Laural Benes anethesia reports it is okay to proceed with surgery.

## 2021-03-27 NOTE — Anesthesia Preprocedure Evaluation (Signed)
Anesthesia Evaluation  Patient identified by MRN, date of birth, ID band Patient awake    Reviewed: Allergy & Precautions, NPO status , Patient's Chart, lab work & pertinent test results  History of Anesthesia Complications (+) Family history of anesthesia reactionNegative for: history of anesthetic complications (Father and cousin with hx of MH)  Airway Mallampati: II       Dental  (+) Upper Dentures, Poor Dentition, Chipped, Missing   Pulmonary neg sleep apnea, neg COPD, Not current smoker,           Cardiovascular hypertension, (-) Past MI and (-) CHF (-) dysrhythmias (-) Valvular Problems/Murmurs     Neuro/Psych neg Seizures    GI/Hepatic Neg liver ROS, neg GERD  ,  Endo/Other  neg diabetes  Renal/GU negative Renal ROS     Musculoskeletal   Abdominal   Peds  Hematology   Anesthesia Other Findings   Reproductive/Obstetrics                             Anesthesia Physical  Anesthesia Plan  ASA: II and emergent  Anesthesia Plan: General   Post-op Pain Management:    Induction: Intravenous  PONV Risk Score and Plan: 2 and Ondansetron and Dexamethasone  Airway Management Planned: Oral ETT  Additional Equipment:   Intra-op Plan:   Post-operative Plan:   Informed Consent: I have reviewed the patients History and Physical, chart, labs and discussed the procedure including the risks, benefits and alternatives for the proposed anesthesia with the patient or authorized representative who has indicated his/her understanding and acceptance.     Dental advisory given  Plan Discussed with: CRNA and Anesthesiologist  Anesthesia Plan Comments:         Anesthesia Quick Evaluation

## 2021-03-27 NOTE — Op Note (Addendum)
  Procedure Date:  03/27/2021  Pre-operative Diagnosis:  Draining perianal wound  Post-operative Diagnosis: Left perianal abscess  Procedure:  Exam under anesthesia, Incision and Drainage of perianal abscess  Surgeon:  Howie Ill, MD  Assistant:  Milagros Reap, PA-S  Anesthesia:  General endotracheal  Estimated Blood Loss:  10 ml  Specimens:  None  Complications:  None  Indications for Procedure:  This is a 60 y.o. male with a recent history of left perianal and ischiorectal abscess, requiring I&D in the past with penrose drains.  He had a persistent draining wound from one of the I&D sites, and he presents today for evaluation in the OR and treatment.  The risks of bleeding, abscess or infection, injury to surrounding structures, and need for further procedures were all discussed with the patient and was willing to proceed.  Description of Procedure: The patient was correctly identified in the preoperative area and brought into the operating room.  The patient was placed supine with VTE prophylaxis in place.  Appropriate time-outs were performed.  Anesthesia was induced and the patient was intubated.  Appropriate antibiotics were infused.  The patient was placed in high lithotomy position.  The patient's perianal area was prepped and draped in usual sterile fashion.  The anal canal was manually dilated with 1 and 2 fingers, and then an anoscope was inserted.  He had internal hemorrhoids that appeared to be grade I, without any bleeding, and there was no lesion noted on the anal mucosa at the level of the draining wound.  The wound itself was probed, and it entered a cavity that tracked anteriorly about 5 cm, but did not appear to track medially to the anal canal.  The cavity was flushed with hydrogen peroxide, and there was no bubbling inside the anal canal.  This was done multiple times.  Following that, the draining wound was opened further and a counter incision was made at the  anterior portion of the cavity.  A 1/4 inch penrose drain was passed through the two wounds and was tied outside using 0 silk ties.  The cavity was further irrigated with saline.  50 ml of Exparel solution mixed with 0.5% bupivacaine with epi was infiltrated around the wounds and perianally and as a left pudendal block.  The skin was then cleaned and dried and the area was dressed with fluffed gauze and mesh underwear.  The patient was then emerged from anesthesia, extubated, and brought to the recovery room for further management.  The patient tolerated the procedure well and all counts were correct at the end of the case.   Howie Ill, MD

## 2021-03-27 NOTE — Interval H&P Note (Signed)
History and Physical Interval Note:  03/27/2021 2:42 PM  Daniel Steele  has presented today for surgery, with the diagnosis of perianal abscess.  The various methods of treatment have been discussed with the patient and family. After consideration of risks, benefits and other options for treatment, the patient has consented to  Procedure(s): EXAM UNDER ANESTHESIA (N/A) INCISION AND DRAINAGE ABSCESS, possible of perianal abscess (N/A) FISTULA PLUG, possible perianal fustula plug, please have in OR @ provider request (N/A) as a surgical intervention.  The patient's history has been reviewed, patient examined, no change in status, stable for surgery.  I have reviewed the patient's chart and labs.  Questions were answered to the patient's satisfaction.     Lanae Federer

## 2021-03-28 ENCOUNTER — Ambulatory Visit: Payer: Self-pay | Admitting: *Deleted

## 2021-03-28 ENCOUNTER — Encounter: Payer: Self-pay | Admitting: Surgery

## 2021-03-28 NOTE — Telephone Encounter (Signed)
Pt had I&D perirectal abscess yesterday. States told prior to surgery BP 191/119. Given AM dose of his Valsartan prior to procedure. Reports remained "About that range post op. States was told by surgeon to notify PCP. During call BP 210/121  5 minutes later 203/118. Denies any associated symptoms; no dizziness, no CP or blurred vision, denies headache. HR reads at 72. Pt denies any pain presently. No missed doses of BP med except "Got yesterdays dose about 6-7 hours late because of surgery, they gave it to me there." Took valsartan 2 hours prior to triage today. Advised pt to lie down, rest. Called pt back 20 minutes later to recheck, states 188/114. Continues to deny any neurological symptoms. Called practice, Toney Reil, will route to practice for PCPs review and final disposition. Pt verbalizes understanding. Advised ED for any headache, dizziness, difficulty with speech, visual changes, unilateral weakness. Pt verbalizes understanding. CB# [phone]

## 2021-03-28 NOTE — Telephone Encounter (Signed)
Reason for Disposition . [1] Systolic BP  >= 200 OR Diastolic >= 120 AND [2] having NO cardiac or neurologic symptoms  Answer Assessment - Initial Assessment Questions 1. BLOOD PRESSURE: "What is the blood pressure?" "Did you take at least two measurements 5 minutes apart?"     210/121  then  203/118 2. ONSET: "When did you take your blood pressure?"     During call. At surgeons yesterday. 3. HOW: "How did you obtain the blood pressure?" (e.g., visiting nurse, automatic home BP monitor)     Home monitor 4. HISTORY: "Do you have a history of high blood pressure?"     yes 5. MEDICATIONS: "Are you taking any medications for blood pressure?" "Have you missed any doses recently?"     6 hours late due to surgery 6. OTHER SYMPTOMS: "Do you have any symptoms?" (e.g., headache, chest pain, blurred vision, difficulty breathing, weakness)     No  Protocols used: Blood Pressure - High-A-AH

## 2021-03-28 NOTE — Telephone Encounter (Signed)
Pt. Calling again to check on advise. Given PCP instructions. Verbalizes understanding.

## 2021-03-28 NOTE — Anesthesia Postprocedure Evaluation (Signed)
Anesthesia Post Note  Patient: Daniel Steele  Procedure(s) Performed: [name] UNDER ANESTHESIA INCISION AND DRAINAGE ABSCESS  Patient location during evaluation: PACU Anesthesia Type: General Level of consciousness: awake and alert Pain management: pain level controlled Vital Signs Assessment: post-procedure vital signs reviewed and stable Respiratory status: spontaneous breathing, nonlabored ventilation, respiratory function stable and patient connected to nasal cannula oxygen Cardiovascular status: blood pressure returned to baseline and stable Postop Assessment: no apparent nausea or vomiting Anesthetic complications: no   No notable events documented.   Last Vitals:  Vitals:   03/27/21 1654 03/27/21 1701  BP: (!) 178/105 (!) 195/114  Pulse: (!) 56 (!) 57  Resp: 14 20  Temp: (!) 36.1 C (!) 36.2 C  SpO2: 97% 98%    Last Pain:  Vitals:   03/27/21 1701  TempSrc: Temporal  PainSc: 0-No pain                 Lenard Simmer

## 2021-03-28 NOTE — Telephone Encounter (Signed)
His BP was not nearly this elevated at recent visit.  He was switched on BP meds, now on Valsartan 160mg  daily.  At this point with elevated readings likely caused by other factors perhaps surgical related and pain possibly can elevated BP.  For now, until he returns to me, he can take TWO Valsartans 160mg  each x 2 = 320mg  once daily in morning.  Monitor BP  He has upcoming lab for blood work and a virtual visit 7/29, however he may need to do in person if his BP remains this elevated.  , DO [facility] 03/28/2021, 12:02 PM

## 2021-03-31 ENCOUNTER — Ambulatory Visit: Payer: Self-pay | Admitting: *Deleted

## 2021-03-31 NOTE — Telephone Encounter (Signed)
Reason for Disposition  [1] Systolic BP  >= 200 OR Diastolic >= 120 AND [2] having NO cardiac or neurologic symptoms  Answer Assessment - Initial Assessment Questions 1. BLOOD PRESSURE: "What is the blood pressure?" "Did you take at least two measurements 5 minutes apart?"    210/130 prior to call then at 4:30 pm 221/126  HR 68 2. ONSET: "When did you take your blood pressure?"     Now  3. HOW: "How did you obtain the blood pressure?" (e.g., visiting nurse, automatic home BP monitor)     Automatic B/P montior 4. HISTORY: "Do you have a history of high blood pressure?"     yes 5. MEDICATIONS: "Are you taking any medications for blood pressure?" "Have you missed any doses recently?"     No  changed B/P meds and increased dose on Friday to twice a day  6. OTHER SYMPTOMS: "Do you have any symptoms?" (e.g., headache, chest pain, blurred vision, difficulty breathing, weakness)     Slight headache, no energy feeling tired 7. PREGNANCY: "Is there any chance you are pregnant?" "When was your last menstrual period?"     na  Protocols used: Blood Pressure - High-A-AH

## 2021-03-31 NOTE — Telephone Encounter (Signed)
C/o elevated B/P . C/o slight headache and feeling tired, no energy. No other symptoms noted. Prior to call B/P 210/130 and then 4:30 pm B/P 221/126 HR 68 at rest . Patient reports recent change in medications. Increased dose of valsartan 160 mg to twice a day on Friday 03/28/21. BP increasing. Mild symptoms reported. Called clinic , Irving Burton RN and reviewed results and symptoms with PCP. Patient advised to go to UC ,if symptoms worsen go to ED to assess BP. Virtual appt already  scheduled for 04/04/21. Patient denies chest pain, difficulty breathing, numbness, weakness on either side, no visual disturbances. Care advise given. Patient verbalized understanding of care advise and to call back or go to [facility] or ED if symptoms worsen.

## 2021-04-04 ENCOUNTER — Other Ambulatory Visit: Payer: Self-pay

## 2021-04-04 ENCOUNTER — Telehealth: Payer: BC Managed Care – PPO | Admitting: Family Medicine

## 2021-04-04 ENCOUNTER — Encounter: Payer: Self-pay | Admitting: Family Medicine

## 2021-04-04 VITALS — Ht 71.5 in | Wt 242.0 lb

## 2021-04-04 DIAGNOSIS — E876 Hypokalemia: Secondary | ICD-10-CM

## 2021-04-04 DIAGNOSIS — I10 Essential (primary) hypertension: Secondary | ICD-10-CM | POA: Diagnosis not present

## 2021-04-04 MED ORDER — OLMESARTAN MEDOXOMIL 40 MG PO TABS: 40.0000 mg | ORAL_TABLET | Freq: Every day | ORAL | 2 refills | Status: DC

## 2021-04-04 MED ORDER — HYDROCHLOROTHIAZIDE 25 MG PO TABS: 25.0000 mg | ORAL_TABLET | Freq: Every day | ORAL | 2 refills | Status: DC

## 2021-04-04 MED ORDER — POTASSIUM CHLORIDE CRYS ER 20 MEQ PO TBCR: 20.0000 meq | EXTENDED_RELEASE_TABLET | Freq: Every day | ORAL | 2 refills | Status: DC

## 2021-04-04 NOTE — Progress Notes (Signed)
Virtual Visit via Telephone The purpose of this virtual visit is to provide medical care while limiting exposure to the novel coronavirus (COVID19) for both patient and office staff.  Consent was obtained for phone visit:  Yes.   Answered questions that patient had about telehealth interaction:  Yes.   I discussed the limitations, risks, security and privacy concerns of performing an evaluation and management service by telephone. I also discussed with the patient that there may be a patient responsible charge related to this service. The patient expressed understanding and agreed to proceed.  Patient Location: Home Provider Location: [facility] (Office)  Participants in virtual visit: - Patient: Daniel Steele - CMA: Orinda Kenner, [place] - Provider: Dr Parks Ranger  ---------------------------------------------------------------------- Chief Complaint  Patient presents with   Hypertension    S: Reviewed CMA documentation. I have called patient and gathered additional HPI as follows:  CHRONIC HTN:  03/07/21 initial visit for HTN, see note. He was on Amlodipine $RemoveBefor'5mg'TgdzmtTISfYg$  that caused some swelling in lower extremities. This was discontinued and switched to Valsartan ARB $RemoveBefor'160mg'ZYaMtCxYmZDp$ . He still had elevated BP readings and ultimately contacted [place] back with elevated BP readings up to 191/119. He had no symptoms at that time. He had surgery for perineal abscess recently 03/27/21.  His Valsartan was increased from 160 to 320 and he had higher BP reading and it did not help.  Today 170/110, seems improved closer to range PRIOR to the dose increase.  He is still asymptomatic.  Also recent Hypokalemia on lab 7/21 w/ surgery. He was given K supplement 25mEq daily still on this.  He has strong fam history HTN Current Meds - Valsartan $RemoveBef'320mg'wBGDeUVafP$  daily Reports good compliance, took meds today. Tolerating well, w/o complaints. Lifestyle: Never smoker Not regularly consuming alcohol now, but  in past age 30s he did drink more at the time, he has been alcohol free since 05/12/1987 - Diet: Admits caffeine can raise BP. Tries to avoid salt sodium - Exercise: active Denies CP, dyspnea, HA, edema, dizziness / lightheadedness   Past Medical History:  Diagnosis Date   Family history of adverse reaction to anesthesia    Family history of malignant hyperthermia    father and cousin   Hypertension    Malignant hyperthermia    Perianal abscess    Social History   Tobacco Use   Smoking status: Never   Smokeless tobacco: Never  Vaping Use   Vaping Use: Never used  Substance Use Topics   Alcohol use: Not Currently    Comment: No alcohol since 05/1987   Drug use: Not Currently    Types: Marijuana    Comment: age 7's    Current Outpatient Medications:    acetaminophen (TYLENOL) 500 MG tablet, Take 2 tablets (1,000 mg total) by mouth every 6 (six) hours as needed for mild pain., Disp: , Rfl:    hydrochlorothiazide (HYDRODIURIL) 25 MG tablet, Take 1 tablet (25 mg total) by mouth daily., Disp: 30 tablet, Rfl: 2   ibuprofen (ADVIL) 800 MG tablet, Take 1 tablet (800 mg total) by mouth every 8 (eight) hours as needed for moderate pain., Disp: 60 tablet, Rfl: 1   olmesartan (BENICAR) 40 MG tablet, Take 1 tablet (40 mg total) by mouth daily., Disp: 30 tablet, Rfl: 2   oxyCODONE (OXY IR/ROXICODONE) 5 MG immediate release tablet, Take 1 tablet (5 mg total) by mouth every 4 (four) hours as needed for severe pain., Disp: 30 tablet, Rfl: 0   potassium chloride  SA (KLOR-CON) 20 MEQ tablet, Take 1 tablet (20 mEq total) by mouth daily., Disp: 30 tablet, Rfl: 2  Depression screen [facility] 2/9 03/07/2021  Decreased Interest 0  Down, Depressed, Hopeless 0  PHQ - 2 Score 0    No flowsheet data found.  -------------------------------------------------------------------------- O: No physical exam performed due to remote telephone encounter.  Lab results reviewed.  Recent Results (from the past 2160  hour(s))  Basic metabolic panel     Status: Abnormal   Collection Time: 01/11/21  1:45 PM  Result Value Ref Range   Sodium 133 (L) 135 - 145 mmol/L   Potassium 2.7 (LL) 3.5 - 5.1 mmol/L    Comment: CRITICAL RESULT CALLED TO, READ BACK BY AND VERIFIED WITH RACHEL HAYDEN AT 1434 01/11/21.PMF   Chloride 94 (L) 98 - 111 mmol/L   CO2 28 22 - 32 mmol/L   Glucose, Bld 111 (H) 70 - 99 mg/dL    Comment: Glucose reference range applies only to samples taken after fasting for at least 8 hours.   BUN 17 6 - 20 mg/dL   Creatinine, Ser 1.11 0.61 - 1.24 mg/dL   Calcium 8.7 (L) 8.9 - 10.3 mg/dL   GFR, Estimated >60 >60 mL/min    Comment: (NOTE) Calculated using the CKD-EPI Creatinine Equation (2021)    Anion gap 11 5 - 15    Comment: Performed at [facility], [address] [place], [place] [address]  CBC with Differential     Status: Abnormal   Collection Time: 01/11/21  1:45 PM  Result Value Ref Range   WBC 10.9 (H) 4.0 - 10.5 K/uL   RBC 4.52 4.22 - 5.81 MIL/uL   Hemoglobin 13.0 13.0 - 17.0 g/dL   HCT 37.3 (L) 39.0 - 52.0 %   MCV 82.5 80.0 - 100.0 fL   MCH 28.8 26.0 - 34.0 pg   MCHC 34.9 30.0 - 36.0 g/dL   RDW 13.5 11.5 - 15.5 %   Platelets 192 150 - 400 K/uL   nRBC 0.0 0.0 - 0.2 %   Neutrophils Relative % 75 %   Neutro Abs 8.3 (H) 1.7 - 7.7 K/uL   Lymphocytes Relative 9 %   Lymphs Abs 0.9 0.7 - 4.0 K/uL   Monocytes Relative 12 %   Monocytes Absolute 1.3 (H) 0.1 - 1.0 K/uL   Eosinophils Relative 2 %   Eosinophils Absolute 0.2 0.0 - 0.5 K/uL   Basophils Relative 1 %   Basophils Absolute 0.1 0.0 - 0.1 K/uL   Immature Granulocytes 1 %   Abs Immature Granulocytes 0.09 (H) 0.00 - 0.07 K/uL    Comment: Performed at [facility], [address] [place], [place] [address]  Resp Panel by RT-PCR (Flu A&B, Covid) Nasopharyngeal Swab     Status: None   Collection Time: 01/13/21  1:01 PM   Specimen: Nasopharyngeal Swab; Nasopharyngeal(NP) swabs in vial transport medium   Result Value Ref Range   SARS Coronavirus 2 by RT PCR NEGATIVE NEGATIVE    Comment: (NOTE) SARS-CoV-2 target nucleic acids are NOT DETECTED.  The SARS-CoV-2 RNA is generally detectable in upper respiratory specimens during the acute phase of infection. The lowest concentration of SARS-CoV-2 viral copies this assay can detect is 138 copies/mL. A negative result does not preclude SARS-Cov-2 infection and should not be used as the sole basis for treatment or other patient management decisions. A negative result may occur with  improper specimen collection/handling, submission of specimen other than nasopharyngeal swab, presence  of viral mutation(s) within the areas targeted by this assay, and inadequate number of viral copies(<138 copies/mL). A negative result must be combined with clinical observations, patient history, and epidemiological information. The expected result is Negative.  Fact Sheet for Patients:  EntrepreneurPulse.com.au  Fact Sheet for Healthcare Providers:  IncredibleEmployment.be  This test is no t yet approved or cleared by the [place] FDA and  has been authorized for detection and/or diagnosis of SARS-CoV-2 by FDA under an Emergency Use Authorization (EUA). This EUA will remain  in effect (meaning this test can be used) for the duration of the COVID-19 declaration under Section 564(b)(1) of the Act, 21 U.S.C.section 360bbb-3(b)(1), unless the authorization is terminated  or revoked sooner.       Influenza A by PCR NEGATIVE NEGATIVE   Influenza B by PCR NEGATIVE NEGATIVE    Comment: (NOTE) The Xpert Xpress SARS-CoV-2/FLU/RSV plus assay is intended as an aid in the diagnosis of influenza from Nasopharyngeal swab specimens and should not be used as a sole basis for treatment. Nasal washings and aspirates are unacceptable for Xpert Xpress SARS-CoV-2/FLU/RSV testing.  Fact Sheet for  Patients: EntrepreneurPulse.com.au  Fact Sheet for Healthcare Providers: IncredibleEmployment.be  This test is not yet approved or cleared by the [place] FDA and has been authorized for detection and/or diagnosis of SARS-CoV-2 by FDA under an Emergency Use Authorization (EUA). This EUA will remain in effect (meaning this test can be used) for the duration of the COVID-19 declaration under Section 564(b)(1) of the Act, 21 U.S.C. section 360bbb-3(b)(1), unless the authorization is terminated or revoked.  Performed at [facility], [address] [place], [place] [address]   CBC WITH DIFFERENTIAL     Status: Abnormal   Collection Time: 01/13/21  1:39 PM  Result Value Ref Range   WBC 13.4 (H) 4.0 - 10.5 K/uL   RBC 4.65 4.22 - 5.81 MIL/uL   Hemoglobin 13.2 13.0 - 17.0 g/dL   HCT 39.0 39.0 - 52.0 %   MCV 83.9 80.0 - 100.0 fL   MCH 28.4 26.0 - 34.0 pg   MCHC 33.8 30.0 - 36.0 g/dL   RDW 13.7 11.5 - 15.5 %   Platelets 206 150 - 400 K/uL   nRBC 0.0 0.0 - 0.2 %   Neutrophils Relative % 82 %   Neutro Abs 11.0 (H) 1.7 - 7.7 K/uL   Lymphocytes Relative 7 %   Lymphs Abs 1.0 0.7 - 4.0 K/uL   Monocytes Relative 7 %   Monocytes Absolute 0.9 0.1 - 1.0 K/uL   Eosinophils Relative 2 %   Eosinophils Absolute 0.2 0.0 - 0.5 K/uL   Basophils Relative 1 %   Basophils Absolute 0.1 0.0 - 0.1 K/uL   Immature Granulocytes 1 %   Abs Immature Granulocytes 0.19 (H) 0.00 - 0.07 K/uL    Comment: Performed at [facility], [address] [place], [place] [address]  Comprehensive metabolic panel     Status: Abnormal   Collection Time: 01/13/21  1:39 PM  Result Value Ref Range   Sodium 132 (L) 135 - 145 mmol/L   Potassium 3.0 (L) 3.5 - 5.1 mmol/L   Chloride 92 (L) 98 - 111 mmol/L   CO2 27 22 - 32 mmol/L   Glucose, Bld 99 70 - 99 mg/dL    Comment: Glucose reference range applies only to samples taken after fasting for at least 8 hours.   BUN  10 6 - 20 mg/dL   Creatinine, Ser 0.94  0.61 - 1.24 mg/dL   Calcium 8.7 (L) 8.9 - 10.3 mg/dL   Total Protein 7.2 6.5 - 8.1 g/dL   Albumin 3.1 (L) 3.5 - 5.0 g/dL   AST 83 (H) 15 - 41 U/L   ALT 69 (H) 0 - 44 U/L   Alkaline Phosphatase 123 38 - 126 U/L   Total Bilirubin 1.2 0.3 - 1.2 mg/dL   GFR, Estimated >60 >60 mL/min    Comment: (NOTE) Calculated using the CKD-EPI Creatinine Equation (2021)    Anion gap 13 5 - 15    Comment: Performed at [facility], [address] [place], [place] [address]  Aerobic/Anaerobic Culture w Gram Stain (surgical/deep wound)     Status: None   Collection Time: 01/13/21  9:49 PM   Specimen: PATH Other; Wound  Result Value Ref Range   Specimen Description      ABSCESS left perianal Performed at [facility], [address] [place], [place] [address]    Special Requests      NONE Performed at [facility], [place], [place] [address]    Gram Stain      NO WBC SEEN MODERATE GRAM POSITIVE COCCI IN PAIRS RARE GRAM POSITIVE RODS    Culture      FEW ESCHERICHIA COLI ABUNDANT STREPTOCOCCUS ANGINOSIS NO ANAEROBES ISOLATED Performed at [facility], [place] [address], [place], [place] [address]    Report Status 01/18/2021 FINAL    Organism ID, Bacteria ESCHERICHIA COLI    Organism ID, Bacteria STREPTOCOCCUS ANGINOSIS       Susceptibility   Escherichia coli - MIC*    AMPICILLIN <=2 SENSITIVE Sensitive     CEFAZOLIN <=4 SENSITIVE Sensitive     CEFEPIME <=0.12 SENSITIVE Sensitive     CEFTAZIDIME <=1 SENSITIVE Sensitive     CEFTRIAXONE <=0.25 SENSITIVE Sensitive     CIPROFLOXACIN <=0.25 SENSITIVE Sensitive     GENTAMICIN <=1 SENSITIVE Sensitive     IMIPENEM <=0.25 SENSITIVE Sensitive     TRIMETH/SULFA <=20 SENSITIVE Sensitive     AMPICILLIN/SULBACTAM <=2 SENSITIVE Sensitive     PIP/TAZO <=4 SENSITIVE Sensitive     * FEW ESCHERICHIA COLI   Streptococcus anginosis - MIC*    PENICILLIN 0.12 SENSITIVE  Sensitive     CEFTRIAXONE 0.5 SENSITIVE Sensitive     ERYTHROMYCIN >=8 RESISTANT Resistant     LEVOFLOXACIN 0.5 SENSITIVE Sensitive     VANCOMYCIN 0.5 SENSITIVE Sensitive     * ABUNDANT STREPTOCOCCUS ANGINOSIS  Basic metabolic panel     Status: Abnormal   Collection Time: 01/14/21  2:48 AM  Result Value Ref Range   Sodium 137 135 - 145 mmol/L   Potassium 2.9 (L) 3.5 - 5.1 mmol/L   Chloride 97 (L) 98 - 111 mmol/L   CO2 31 22 - 32 mmol/L   Glucose, Bld 196 (H) 70 - 99 mg/dL    Comment: Glucose reference range applies only to samples taken after fasting for at least 8 hours.   BUN 9 6 - 20 mg/dL   Creatinine, Ser 1.14 0.61 - 1.24 mg/dL   Calcium 8.8 (L) 8.9 - 10.3 mg/dL   GFR, Estimated >60 >60 mL/min    Comment: (NOTE) Calculated using the CKD-EPI Creatinine Equation (2021)    Anion gap 9 5 - 15    Comment: Performed at [facility], [address], [place], [place] [address]  Magnesium     Status: None   Collection Time: 01/14/21  2:48 AM  Result Value Ref Range   Magnesium 2.3 1.7 - 2.4 mg/dL    Comment: Performed at [facility], [address] [place], [place] [address]  CBC     Status: Abnormal   Collection Time: 01/14/21  2:48 AM  Result Value Ref Range   WBC 14.7 (H) 4.0 - 10.5 K/uL   RBC 4.41 4.22 - 5.81 MIL/uL   Hemoglobin 12.5 (L) 13.0 - 17.0 g/dL   HCT 14.7 (L) 54.5 - 73.4 %   MCV 83.4 80.0 - 100.0 fL   MCH 28.3 26.0 - 34.0 pg   MCHC 34.0 30.0 - 36.0 g/dL   RDW 57.8 99.0 - 41.7 %   Platelets 233 150 - 400 K/uL   nRBC 0.0 0.0 - 0.2 %    Comment: Performed at [facility], [address] [place], [place] [address]  HIV Antibody (routine testing w rflx)     Status: None   Collection Time: 01/14/21  2:48 AM  Result Value Ref Range   HIV Screen 4th Generation wRfx Non Reactive Non Reactive    Comment: Performed at [facility], [address] [place], [place] [address]  CBC     Status: Abnormal   Collection Time: 01/15/21   3:27 AM  Result Value Ref Range   WBC 9.3 4.0 - 10.5 K/uL   RBC 3.98 (L) 4.22 - 5.81 MIL/uL   Hemoglobin 11.5 (L) 13.0 - 17.0 g/dL   HCT 53.8 (L) 42.1 - 51.3 %   MCV 83.9 80.0 - 100.0 fL   MCH 28.9 26.0 - 34.0 pg   MCHC 34.4 30.0 - 36.0 g/dL   RDW 17.6 63.3 - 51.1 %   Platelets 229 150 - 400 K/uL   nRBC 0.0 0.0 - 0.2 %    Comment: Performed at [facility], [address], [place], [place] [address]  Basic metabolic panel     Status: Abnormal   Collection Time: 01/15/21  3:27 AM  Result Value Ref Range   Sodium 140 135 - 145 mmol/L   Potassium 3.2 (L) 3.5 - 5.1 mmol/L   Chloride 105 98 - 111 mmol/L   CO2 29 22 - 32 mmol/L   Glucose, Bld 123 (H) 70 - 99 mg/dL    Comment: Glucose reference range applies only to samples taken after fasting for at least 8 hours.   BUN 15 6 - 20 mg/dL   Creatinine, Ser 4.14 0.61 - 1.24 mg/dL   Calcium 8.2 (L) 8.9 - 10.3 mg/dL   GFR, Estimated >75 >36 mL/min    Comment: (NOTE) Calculated using the CKD-EPI Creatinine Equation (2021)    Anion gap 6 5 - 15    Comment: Performed at [facility], [address] [place], [place] [address]  CBC     Status: Abnormal   Collection Time: 02/19/21  1:03 PM  Result Value Ref Range   WBC 8.1 4.0 - 10.5 K/uL   RBC 4.65 4.22 - 5.81 MIL/uL   Hemoglobin 13.2 13.0 - 17.0 g/dL   HCT 06.0 (L) 71.4 - 95.0 %   MCV 83.0 80.0 - 100.0 fL   MCH 28.4 26.0 - 34.0 pg   MCHC 34.2 30.0 - 36.0 g/dL   RDW 36.4 86.0 - 39.7 %   Platelets 200 150 - 400 K/uL   nRBC 0.0 0.0 - 0.2 %    Comment: Performed at [facility], [address], [place], [place] [address]  Comprehensive metabolic panel     Status: Abnormal   Collection Time: 02/19/21  1:03 PM  Result Value Ref Range   Sodium 140 135 - 145 mmol/L   Potassium 2.9 (L) 3.5 - 5.1 mmol/L   Chloride 99 98 - 111 mmol/L   CO2 31 22 - 32 mmol/L   Glucose, Bld 104 (H) 70 - 99 mg/dL    Comment: Glucose reference range applies only to samples taken  after fasting for at least 8 hours.   BUN 8 6 - 20 mg/dL   Creatinine, Ser 0.88 0.61 - 1.24 mg/dL   Calcium 8.8 (L) 8.9 - 10.3 mg/dL   Total Protein 7.6 6.5 - 8.1 g/dL   Albumin 3.9 3.5 - 5.0 g/dL   AST 23 15 - 41 U/L   ALT 14 0 - 44 U/L   Alkaline Phosphatase 72 38 - 126 U/L   Total Bilirubin 1.0 0.3 - 1.2 mg/dL   GFR, Estimated >60 >60 mL/min    Comment: (NOTE) Calculated using the CKD-EPI Creatinine Equation (2021)    Anion gap 10 5 - 15    Comment: Performed at [facility], [place], [place] [address]  Troponin I (High Sensitivity)     Status: None   Collection Time: 02/19/21  1:03 PM  Result Value Ref Range   Troponin I (High Sensitivity) 11 <18 ng/L    Comment: (NOTE) Elevated high sensitivity troponin I (hsTnI) values and significant  changes across serial measurements may suggest ACS but many other  chronic and acute conditions are known to elevate hsTnI results.  Refer to the "Links" section for chest pain algorithms and additional  guidance. Performed at [facility], [address] [place], [place] [address]   Potassium     Status: Abnormal   Collection Time: 03/25/21  4:34 PM  Result Value Ref Range   Potassium 2.9 (L) 3.5 - 5.1 mmol/L    Comment: Performed at [facility], [address] [place], [place] [address]  BASIC METABOLIC PANEL WITH GFR     Status: None   Collection Time: 03/27/21  8:06 AM  Result Value Ref Range   Glucose, Bld 90 65 - 139 mg/dL    Comment: .        Non-fasting reference interval .    BUN 11 7 - 25 mg/dL   Creat 1.04 0.70 - 1.30 mg/dL   eGFR 83 > OR = 60 mL/min/1.34m2    Comment: The eGFR is based on the CKD-EPI 2021 equation. To calculate  the new eGFR from a previous Creatinine or Cystatin C result, go to https://www.kidney.org/professionals/ kdoqi/gfr%5Fcalculator    BUN/Creatinine Ratio NOT APPLICABLE 6 - 22 (calc)   Sodium 142 135 - 146 mmol/L   Potassium 3.8 3.5 - 5.3  mmol/L   Chloride 104 98 - 110 mmol/L   CO2 31 20 - 32 mmol/L   Calcium 9.0 8.6 - 10.3 mg/dL  I-STAT, chem 8     Status: Abnormal   Collection Time: 03/27/21  1:07 PM  Result Value Ref Range   Sodium 142 135 - 145 mmol/L   Potassium 3.0 (L) 3.5 - 5.1 mmol/L   Chloride 103 98 - 111 mmol/L   BUN 9 6 - 20 mg/dL   Creatinine, Ser 1.00 0.61 - 1.24 mg/dL   Glucose, Bld 98 70 - 99 mg/dL    Comment: Glucose reference range applies only to samples taken after fasting for at least 8 hours.  Calcium, Ion 1.12 (L) 1.15 - 1.40 mmol/L   TCO2 29 22 - 32 mmol/L   Hemoglobin 12.2 (L) 13.0 - 17.0 g/dL   HCT 36.0 (L) 39.0 - 52.0 %    -------------------------------------------------------------------------- A&P:  Problem List Items Addressed This Visit     Essential hypertension - Primary    Elevated BP uncontrolled, uncertain if other etiology Chronic history HTN - Home BP readings still elevated  W/ Hypokalemia Fam history HTN Failed Amlodipine $RemoveBeforeDEI'5mg'dGLzcQNbpMOHnjwI$  due to swelling   Plan:  1. STOP Valsartan $RemoveBefore'320mg'pFuyPaVecFSic$  seems ineffective 2. START Olmesartan $RemoveBeforeDE'40mg'uFbkiJIzGKgJtdx$  daily switch agent still ARB, and ADD NEW HCTZ $Remove'25mg'rFadIWe$  daily - Caution with HypoK - REFILL Potassium 43mEq daily keep on this 3. Encourage improved lifestyle - low sodium diet, regular exercise 4. monitor BP outside office, bring readings to next visit, if persistently >140/90 or new symptoms notify office sooner  Call / message [place] with BP updates 1-2 weeks, if uncontrolled will need to check BMET chemistry sooner and re-discuss, offer Adv HTN Clinic referral. Or if better can f/u 3-4 weeks with BMET and follow-up       Relevant Medications   hydrochlorothiazide (HYDRODIURIL) 25 MG tablet   potassium chloride SA (KLOR-CON) 20 MEQ tablet   olmesartan (BENICAR) 40 MG tablet   Other Relevant Orders   BASIC METABOLIC PANEL WITH GFR   Other Visit Diagnoses     Hypokalemia           Meds ordered this encounter  Medications   hydrochlorothiazide  (HYDRODIURIL) 25 MG tablet    Sig: Take 1 tablet (25 mg total) by mouth daily.    Dispense:  30 tablet    Refill:  2   potassium chloride SA (KLOR-CON) 20 MEQ tablet    Sig: Take 1 tablet (20 mEq total) by mouth daily.    Dispense:  30 tablet    Refill:  2   olmesartan (BENICAR) 40 MG tablet    Sig: Take 1 tablet (40 mg total) by mouth daily.    Dispense:  30 tablet    Refill:  2    Please cancel the Valsartan refill, we will switch to this one Olmesartan instead.    Follow-up: - Return in 4 weeks HTN / Lab BMET chemistry  Patient verbalizes understanding with the above medical recommendations including the limitation of remote medical advice.  Specific follow-up and call-back criteria were given for patient to follow-up or seek medical care more urgently if needed.   - Time spent in direct consultation with patient on phone: 15 minutes  Nobie Putnam, [facility] 04/04/2021, 10:49 AM

## 2021-04-04 NOTE — Assessment & Plan Note (Signed)
Elevated BP uncontrolled, uncertain if other etiology Chronic history HTN - Home BP readings still elevated  W/ Hypokalemia Fam history HTN Failed Amlodipine 5mg  due to swelling   Plan:  1. STOP Valsartan 320mg  seems ineffective 2. START Olmesartan 40mg  daily switch agent still ARB, and ADD NEW HCTZ 25mg  daily - Caution with HypoK - REFILL Potassium daily keep on this 3. Encourage improved lifestyle - low sodium diet, regular exercise 4. monitor BP outside office, bring readings to next visit, if persistently >140/90 or new symptoms notify office sooner  Call / message with BP updates 1-2 weeks, if uncontrolled will need to check BMET chemistry sooner and re-discuss, offer Adv HTN Clinic referral. Or if better can f/u 3-4 weeks with BMET and follow-up

## 2021-04-04 NOTE — Patient Instructions (Addendum)
Stop Valsartan. DC'd at pharmacy START Olmesartan 40mg  daily START Hydrochlorothiazide HCTZ 25mg  daily KEEP ON Potassium daily  Blood work due 2-3 weeks. Call to schedule or walk in  Call w/ BP readings 1-2 weeks, sooner if still elevated >160/100  Please schedule a Follow-up Appointment to: Return in about 3 weeks (around 04/25/2021) for 3 weeks BMET lab only then 1 week later HTN F/u.  If you have any other questions or concerns, please feel free to call the office or send a message through MyChart. You may also schedule an earlier appointment if necessary.  Additionally, you may be receiving a survey about your experience at our office within a few days to 1 week by e-mail or mail. We value your feedback.  [place], DO [facility], Saralyn Pilar

## 2021-04-21 ENCOUNTER — Other Ambulatory Visit: Payer: Self-pay

## 2021-04-21 ENCOUNTER — Encounter: Payer: Self-pay | Admitting: Surgery

## 2021-04-21 ENCOUNTER — Ambulatory Visit (INDEPENDENT_AMBULATORY_CARE_PROVIDER_SITE_OTHER): Payer: BC Managed Care – PPO | Admitting: Surgery

## 2021-04-21 VITALS — BP 154/127 | HR 68 | Temp 98.7°F | Ht 71.5 in | Wt 245.2 lb

## 2021-04-21 DIAGNOSIS — K610 Anal abscess: Secondary | ICD-10-CM

## 2021-04-21 DIAGNOSIS — Z09 Encounter for follow-up examination after completed treatment for conditions other than malignant neoplasm: Secondary | ICD-10-CM

## 2021-04-21 NOTE — Progress Notes (Signed)
04/21/2021  HPI: Daniel Steele is a 60 y.o. male s/p I&D of left perianal abscess on 03/27/2021.  He was found to have a remaining abscess cavity and a new Penrose drain was placed.  He presents today for follow-up.  Reports that he has been doing well and denies any worsening pain or fevers.  He reports that the area has looked "good".  Denies any purulent drainage.  Vital signs: BP (!) 154/127   Pulse 68   Temp 98.7 F (37.1 C) (Oral)   Ht 5' 11.5" (1.816 m)   Wt 245 lb 3.2 oz (111.2 kg)   SpO2 96%   BMI 33.72 kg/m    Physical Exam: Constitutional: No acute distress Skin: Left perianal I&D sites are healing well with good granulation tissue and no purulent drainage.  There is no appreciable cellulitis or induration.  Penrose drain was removed.  Q-tip probing of the 2 incisions revealed a shallow cavity.  Wound was dressed with dry gauze dressing.  Assessment/Plan: This is a 60 y.o. male s/p I&D of left perianal abscess.  - Penrose drain removed today at bedside without complications.  Dry gauze dressing applied.  Recommended to the patient that given that this is very close to the anal verge, that he should change dry gauze dressings frequently to keep the area clean and dry to prevent any contamination of the wounds. - Follow-up in 2 weeks for wound check.   Howie Ill, MD [facility]

## 2021-04-21 NOTE — Patient Instructions (Signed)
We removed the drain today. You will need to change the gauze frequently during the day when soiled. Be sure to wash the area with soap and water and especially after each bowel movement.

## 2021-05-07 ENCOUNTER — Other Ambulatory Visit: Payer: Self-pay

## 2021-05-07 ENCOUNTER — Encounter: Payer: Self-pay | Admitting: Surgery

## 2021-05-07 ENCOUNTER — Encounter: Payer: BC Managed Care – PPO | Admitting: Surgery

## 2021-05-07 ENCOUNTER — Ambulatory Visit (INDEPENDENT_AMBULATORY_CARE_PROVIDER_SITE_OTHER): Payer: BC Managed Care – PPO | Admitting: Surgery

## 2021-05-07 VITALS — BP 161/98 | HR 66 | Temp 98.6°F | Ht 71.5 in | Wt 250.0 lb

## 2021-05-07 DIAGNOSIS — Z09 Encounter for follow-up examination after completed treatment for conditions other than malignant neoplasm: Secondary | ICD-10-CM

## 2021-05-07 DIAGNOSIS — K610 Anal abscess: Secondary | ICD-10-CM

## 2021-05-07 NOTE — Patient Instructions (Signed)
If you have any concerns or questions, please feel free to call our office. Follow up as needed.  

## 2021-05-07 NOTE — Progress Notes (Signed)
05/07/2021  HPI: Daniel Steele is a 60 y.o. male s/p I&D of perianal abscess on 7/21 for a residual abscess pocket in the left perianal area from a prior larger perianal and ischiorectal abscess I&D done on 01/13/21.  He presents today for follow up.  On his last visit on 04/21/21, his penrose drain was removed.  Today, he reports that he's doing well.  Denies any pain and reports only some mild serosanguinous drainage on the gauze.  Vital signs: BP (!) 161/98   Pulse 66   Temp 98.6 F (37 C) (Oral)   Ht 5' 11.5" (1.816 m)   Wt 250 lb (113.4 kg)   SpO2 97%   BMI 34.39 kg/m    Physical Exam: Constitutional:  No acute distress Skin:  Left perianal I&D sites x 2 examined.  The posterior wound is still open, measuring 3-4 mm in size, very shallow, healthy granulation tissue, without purulence or erythema or tenderness.  The anterior wound has healed.  Assessment/Plan: This is a 60 y.o. male s/p I&D of left perianal abscess.  --Patient is healing well.  The remaining open wound is small and shallow and should continue healing well.  Discussed with him the importance of keeping the area clean and dry given his location next to the anal opening.  I think it would be useful for him to shower to cleanse the area well after each bowel movement.  This will continue to heal. - Follow-up as needed.   Howie Ill, MD [facility]

## 2021-05-16 ENCOUNTER — Encounter: Payer: BC Managed Care – PPO | Admitting: Surgery

## 2021-06-27 ENCOUNTER — Other Ambulatory Visit: Payer: Self-pay | Admitting: Family Medicine

## 2021-06-27 DIAGNOSIS — I10 Essential (primary) hypertension: Secondary | ICD-10-CM

## 2021-06-27 NOTE — Telephone Encounter (Signed)
Requested medications are due for refill today yes  Requested medications are on the active medication list yes  Last refill 05/29/21  Last visit 04/04/21  Future visit scheduled NO  Notes to clinic Pt was to return in 4 weeks for repeated BMET due to K+ was low. Pt did not return and there is no upcoming visit.  Requested Prescriptions  Pending Prescriptions Disp Refills   hydrochlorothiazide (HYDRODIURIL) 25 MG tablet [Pharmacy Med Name: HYDROCHLOROTHIAZIDE 25 MG TAB] 30 tablet 2    Sig: TAKE 1 TABLET (25 MG TOTAL) BY MOUTH DAILY.     Cardiovascular: Diuretics - Thiazide Failed - 06/27/2021  1:43 AM      Failed - Ca in normal range and within 360 days    Calcium  Date Value Ref Range Status  03/27/2021 9.0 8.6 - 10.3 mg/dL Final   Calcium, Ion  Date Value Ref Range Status  03/27/2021 1.12 (L) 1.15 - 1.40 mmol/L Final          Failed - K in normal range and within 360 days    Potassium  Date Value Ref Range Status  03/27/2021 3.0 (L) 3.5 - 5.1 mmol/L Final          Failed - Last BP in normal range    BP Readings from Last 1 Encounters:  05/07/21 (!) 161/98          Passed - Cr in normal range and within 360 days    Creat  Date Value Ref Range Status  03/27/2021 1.04 0.70 - 1.30 mg/dL Final   Creatinine, Ser  Date Value Ref Range Status  03/27/2021 1.00 0.61 - 1.24 mg/dL Final          Passed - Na in normal range and within 360 days    Sodium  Date Value Ref Range Status  03/27/2021 142 135 - 145 mmol/L Final          Passed - Valid encounter within last 6 months    Recent Outpatient Visits           2 months ago Essential hypertension   [facility] Smitty Cords, DO   3 months ago Essential hypertension   [facility] [place], Netta Neat, DO               olmesartan (BENICAR) 40 MG tablet [Pharmacy Med Name: OLMESARTAN MEDOXOMIL 40 MG TAB] 30 tablet 2    Sig: TAKE 1 TABLET BY MOUTH EVERY DAY      Cardiovascular:  Angiotensin Receptor Blockers Failed - 06/27/2021  1:43 AM      Failed - K in normal range and within 180 days    Potassium  Date Value Ref Range Status  03/27/2021 3.0 (L) 3.5 - 5.1 mmol/L Final          Failed - Last BP in normal range    BP Readings from Last 1 Encounters:  05/07/21 (!) 161/98          Passed - Cr in normal range and within 180 days    Creat  Date Value Ref Range Status  03/27/2021 1.04 0.70 - 1.30 mg/dL Final   Creatinine, Ser  Date Value Ref Range Status  03/27/2021 1.00 0.61 - 1.24 mg/dL Final          Passed - Patient is not pregnant      Passed - Valid encounter within last 6 months    Recent Outpatient Visits  2 months ago Essential hypertension   [facility], Netta Neat, DO   3 months ago Essential hypertension   [facility] [place], Netta Neat, [place]

## 2022-06-03 ENCOUNTER — Encounter: Payer: Self-pay | Admitting: Family Medicine

## 2022-06-03 ENCOUNTER — Ambulatory Visit (INDEPENDENT_AMBULATORY_CARE_PROVIDER_SITE_OTHER): Payer: 59 | Admitting: Family Medicine

## 2022-06-03 VITALS — BP 164/110 | HR 59 | Ht 72.0 in | Wt 238.8 lb

## 2022-06-03 DIAGNOSIS — I10 Essential (primary) hypertension: Secondary | ICD-10-CM | POA: Diagnosis not present

## 2022-06-03 MED ORDER — POTASSIUM CHLORIDE CRYS ER 20 MEQ PO TBCR: 20.0000 meq | EXTENDED_RELEASE_TABLET | Freq: Every day | ORAL | 1 refills | Status: DC

## 2022-06-03 MED ORDER — CHLORTHALIDONE 25 MG PO TABS: 25.0000 mg | ORAL_TABLET | Freq: Every day | ORAL | 1 refills | Status: DC

## 2022-06-03 MED ORDER — OLMESARTAN MEDOXOMIL 40 MG PO TABS: 40.0000 mg | ORAL_TABLET | Freq: Every day | ORAL | 1 refills | Status: DC

## 2022-06-03 NOTE — Progress Notes (Unsigned)
Subjective:    Patient ID: Daniel Steele, male    DOB: 16-Feb-1961, 61 y.o.   MRN: [id number]  Daniel Steele is a 61 y.o. male presenting on 06/03/2022 for Hypertension  Last seen 03/2021  HPI  CHRONIC HTN: Lost to follow up since last visit 03/2021, initiated changes for his BP management and has since run out of medication. Reports today that he has been out of meds for >6+ months, says that they were not effective, no readings for BP to review today - History reviewed, with strong fam history HTN - Prior meds off Amlodipine 5mg  due to leg swelling, Valsartan 320 was ineffective  Today reports still high BP, his sister is able to check BP occasionally home reading [id number] systolic. He has occasional headache Admits eating pork and it can raise his BP  Currently off all meds - HCTZ 25mg  and Olmesartan 40mg   Not regularly consuming alcohol now, but in past age 74s he did drink more at the time, he has been alcohol free since 05/12/1987  - Diet: Admits caffeine can raise BP. Still improving diet but admits not eating right - Exercise: active Denies CP, dyspnea, HA, edema, dizziness / lightheadedness       03/07/2021    4:47 PM  Depression screen PHQ 2/9  Decreased Interest 0  Down, Depressed, Hopeless 0  PHQ - 2 Score 0    Past Medical History:  Diagnosis Date   Family history of adverse reaction to anesthesia    Family history of malignant hyperthermia    father and cousin   Hypertension    Malignant hyperthermia    Perianal abscess    Past Surgical History:  Procedure Laterality Date   ARTERY REPAIR Left 1974   left arm artery laceration from accidental window breakage   CHOLECYSTECTOMY  2011   INCISION AND DRAINAGE ABSCESS N/A 01/13/2021   Procedure: INCISION AND DRAINAGE ABSCESS perirectal;  Surgeon: Olean Ree, MD;  Location: [facility] ORS;  Service: General;  Laterality: N/A;   INCISION AND DRAINAGE ABSCESS N/A 03/27/2021   Procedure: INCISION AND DRAINAGE  ABSCESS;  Surgeon: Olean Ree, MD;  Location: [facility] ORS;  Service: General;  Laterality: N/A;   Social History   Socioeconomic History   Marital status: Married    Spouse name: Ginger   Number of children: 2   Years of education: Not on file   Highest education level: Not on file  Occupational History   Not on file  Tobacco Use   Smoking status: Never   Smokeless tobacco: Never  Vaping Use   Vaping Use: Never used  Substance and Sexual Activity   Alcohol use: Not Currently    Comment: No alcohol since 05/1987   Drug use: Not Currently    Types: Marijuana    Comment: age 72's   Sexual activity: Not on file  Other Topics Concern   Not on file  Social History Narrative   Not on file   Social Determinants of Health   Financial Resource Strain: Not on file  Food Insecurity: Not on file  Transportation Needs: Not on file  Physical Activity: Not on file  Stress: Not on file  Social Connections: Not on file  Intimate Partner Violence: Not on file   Family History  Problem Relation Age of Onset   Ovarian cancer Mother    Cancer Father    Brain cancer Father    Depression Father    Current Outpatient Medications on File Prior to  Visit  Medication Sig   acetaminophen (TYLENOL) 500 MG tablet Take 2 tablets (1,000 mg total) by mouth every 6 (six) hours as needed for mild pain.   ibuprofen (ADVIL) 800 MG tablet Take 1 tablet (800 mg total) by mouth every 8 (eight) hours as needed for moderate pain.   No current facility-administered medications on file prior to visit.    Review of Systems Per HPI unless specifically indicated above      Objective:    BP (!) 164/110 (BP Location: Left Arm, Cuff Size: Normal)   Pulse (!) 59   Ht 6' (1.829 m)   Wt 238 lb 12.8 oz (108.3 kg)   SpO2 100%   BMI 32.39 kg/m   Wt Readings from Last 3 Encounters:  06/03/22 238 lb 12.8 oz (108.3 kg)  05/07/21 250 lb (113.4 kg)  04/21/21 245 lb 3.2 oz (111.2 kg)    Physical Exam Vitals  and nursing note reviewed.  Constitutional:      General: He is not in acute distress.    Appearance: He is well-developed. He is not diaphoretic.     Comments: Well-appearing, comfortable, cooperative  HENT:     Head: Normocephalic and atraumatic.  Eyes:     General:        Right eye: No discharge.        Left eye: No discharge.     Conjunctiva/sclera: Conjunctivae normal.  Neck:     Thyroid: No thyromegaly.  Cardiovascular:     Rate and Rhythm: Normal rate and regular rhythm.     Pulses: Normal pulses.     Heart sounds: Normal heart sounds. No murmur heard. Pulmonary:     Effort: Pulmonary effort is normal. No respiratory distress.     Breath sounds: Normal breath sounds. No wheezing or rales.  Musculoskeletal:        General: Normal range of motion.     Cervical back: Normal range of motion and neck supple.  Lymphadenopathy:     Cervical: No cervical adenopathy.  Skin:    General: Skin is warm and dry.     Findings: No erythema or rash.  Neurological:     Mental Status: He is alert and oriented to person, place, and time. Mental status is at baseline.  Psychiatric:        Behavior: Behavior normal.     Comments: Well groomed, good eye contact, normal speech and thoughts    Results for orders placed or performed during the hospital encounter of 03/27/21  I-STAT, chem 8  Result Value Ref Range   Sodium 142 135 - 145 mmol/L   Potassium 3.0 (L) 3.5 - 5.1 mmol/L   Chloride 103 98 - 111 mmol/L   BUN 9 6 - 20 mg/dL   Creatinine, Ser 1.00 0.61 - 1.24 mg/dL   Glucose, Bld 98 70 - 99 mg/dL   Calcium, Ion 1.12 (L) 1.15 - 1.40 mmol/L   TCO2 29 22 - 32 mmol/L   Hemoglobin 12.2 (L) 13.0 - 17.0 g/dL   HCT 36.0 (L) 39.0 - 52.0 %      Assessment & Plan:   Problem List Items Addressed This Visit     Essential hypertension - Primary    Still severely Elevated BP uncontrolled, uncertain if other etiology underlying He was lost to follow up for 1 year, off meds for >6+  months  Chronic history HTN - Home BP readings still elevated  W/ Hypokalemia previously Fam history HTN Failed Amlodipine 5mg   due to swelling, Valsartan 320mg  ineffective   Plan:  1. Switch HCTZ 25 to Chlorthalidone 25mg  daily for inc potency 2. Restart Olmesartan 40mg  daily - REFILL Potassium 5mEq daily keep on this 3. Encourage improved lifestyle - low sodium diet, regular exercise 4. monitor BP outside office, bring readings to next visit, if persistently >140/90 or new symptoms notify office sooner  Recommend future referral Adv HTN Clinic if unable to control BP  Considered OSA or secondary underlying causes as well, now that patient has followed up. Will check all labs within 4 weeks and follow-up.  Future med consider Spironolactone      Relevant Medications   chlorthalidone (HYGROTON) 25 MG tablet   olmesartan (BENICAR) 40 MG tablet   potassium chloride SA (KLOR-CON M) 20 MEQ tablet    Meds ordered this encounter  Medications   chlorthalidone (HYGROTON) 25 MG tablet    Sig: Take 1 tablet (25 mg total) by mouth daily.    Dispense:  90 tablet    Refill:  1   olmesartan (BENICAR) 40 MG tablet    Sig: Take 1 tablet (40 mg total) by mouth daily.    Dispense:  90 tablet    Refill:  1   potassium chloride SA (KLOR-CON M) 20 MEQ tablet    Sig: Take 1 tablet (20 mEq total) by mouth daily.    Dispense:  90 tablet    Refill:  1      Follow up plan: Return in about 4 weeks (around 07/01/2022) for 4 weeks fasting lab then 1 week later Followup labs HTN.  Future labs 07/01/22  Nobie Putnam, [facility] 06/03/2022, 4:21 PM

## 2022-06-03 NOTE — Patient Instructions (Addendum)
Thank you for coming to the office today.  New BP medications ordered  Chlorthalidone 25mg  daily and Olmesartan 40mg  daily Also continue the Potassium supplement daily as well.  CHeck BP regularly at first. Then can skip some days.  Goal < 150 / 90   DUE for FASTING BLOOD WORK (no food or drink after midnight before the lab appointment, only water or coffee without cream/sugar on the morning of)  SCHEDULE "Lab Only" visit in the morning at the clinic for lab draw in 4 WEEKS   - Make sure Lab Only appointment is at about 1 week before your next appointment, so that results will be available  For Lab Results, once available within 2-3 days of blood draw, you can can log in to MyChart online to view your results and a brief explanation. Also, we can discuss results at next follow-up visit.   Please schedule a Follow-up Appointment to: Return in about 4 weeks (around 07/01/2022) for 4 weeks fasting lab then 1 week later Followup labs HTN.  If you have any other questions or concerns, please feel free to call the office or send a message through [place]. You may also schedule an earlier appointment if necessary.  Additionally, you may be receiving a survey about your experience at our office within a few days to 1 week by e-mail or mail. We value your feedback.  Nobie Putnam, DO [place]

## 2022-06-04 ENCOUNTER — Other Ambulatory Visit: Payer: Self-pay | Admitting: Family Medicine

## 2022-06-04 DIAGNOSIS — R351 Nocturia: Secondary | ICD-10-CM

## 2022-06-04 DIAGNOSIS — I10 Essential (primary) hypertension: Secondary | ICD-10-CM

## 2022-06-04 DIAGNOSIS — R7309 Other abnormal glucose: Secondary | ICD-10-CM

## 2022-06-04 DIAGNOSIS — Z13220 Encounter for screening for lipoid disorders: Secondary | ICD-10-CM

## 2022-06-04 NOTE — Assessment & Plan Note (Signed)
Still severely Elevated BP uncontrolled, uncertain if other etiology underlying He was lost to follow up for 1 year, off meds for >6+ months  Chronic history HTN - Home BP readings still elevated  W/ Hypokalemia previously Fam history HTN Failed Amlodipine 5mg  due to swelling, Valsartan 320mg  ineffective   Plan:  1. Switch HCTZ 25 to Chlorthalidone 25mg  daily for inc potency 2. Restart Olmesartan 40mg  daily - REFILL Potassium 53mEq daily keep on this 3. Encourage improved lifestyle - low sodium diet, regular exercise 4. monitor BP outside office, bring readings to next visit, if persistently >140/90 or new symptoms notify office sooner  Recommend future referral Adv HTN Clinic if unable to control BP  Considered OSA or secondary underlying causes as well, now that patient has followed up. Will check all labs within 4 weeks and follow-up.  Future med consider Spironolactone

## 2022-07-01 ENCOUNTER — Other Ambulatory Visit: Payer: 59

## 2022-07-10 ENCOUNTER — Ambulatory Visit: Payer: 59 | Admitting: Family Medicine

## 2023-04-26 ENCOUNTER — Ambulatory Visit: Payer: 59 | Admitting: Family Medicine

## 2024-04-05 ENCOUNTER — Ambulatory Visit: Admitting: Family Medicine

## 2024-04-05 ENCOUNTER — Encounter: Payer: Self-pay | Admitting: Family Medicine

## 2024-04-05 VITALS — BP 132/84 | HR 88 | Ht 73.0 in | Wt 264.5 lb

## 2024-04-05 DIAGNOSIS — I10 Essential (primary) hypertension: Secondary | ICD-10-CM

## 2024-04-05 MED ORDER — LISINOPRIL 20 MG PO TABS: 20.0000 mg | ORAL_TABLET | Freq: Every day | ORAL | 1 refills | Status: DC

## 2024-04-05 MED ORDER — HYDROCHLOROTHIAZIDE 25 MG PO TABS: 25.0000 mg | ORAL_TABLET | Freq: Every day | ORAL | 1 refills | Status: DC

## 2024-04-05 NOTE — Patient Instructions (Addendum)
 Thank you for coming to the office today.  Refilled medications.  We are working with [facility] to try to expedite the referral. It may require an official order from the [facility], or we can try to help.  DUE for FASTING BLOOD WORK (no food or drink after midnight before the lab appointment, only water or coffee without cream/sugar on the morning of)  SCHEDULE Lab Only visit in the morning at the clinic for lab draw in 1-3 months  - Make sure Lab Only appointment is at about 1 week before your next appointment, so that results will be available  For Lab Results, once available within 2-3 days of blood draw, you can can log in to MyChart online to view your results and a brief explanation. Also, we can discuss results at next follow-up visit.   Please schedule a Follow-up Appointment to: Return for 1-3 months fasting lab AM then 1 week later Annual Physical.  If you have any other questions or concerns, please feel free to call the office or send a message through MyChart. You may also schedule an earlier appointment if necessary.  Additionally, you may be receiving a survey about your experience at our office within a few days to 1 week by e-mail or mail. We value your feedback.  Marsa Officer, DO [facility], [place]

## 2024-04-05 NOTE — Progress Notes (Unsigned)
 Subjective:    Patient ID: Daniel Steele, male    DOB: 08-28-1961, 63 y.o.   MRN: [id number]  Daniel Steele is a 63 y.o. male presenting on 04/05/2024 for Hospitalization Follow-up  Last visit with me 05/2022. He has been lost to follow-up since that time.  HPI  Discussed the use of AI scribe software for clinical note transcription with the patient, who gave verbal consent to proceed.  History of Present Illness   Daniel Steele is a 63 year Steele male who presents for follow-up after a traumatic fall resulting in multiple injuries. He is accompanied by his wife, Ginger.  Traumatic injuries and hospitalization - Sustained a traumatic fall on March 08, 2024, while hiking in [place] , falling approximately 30 feet - Injuries included a significant leg laceration, five rib fractures, and a punctured lung - Initial treatment at [facility] with a 9-day trauma intensive care unit stay and a total hospital stay of 12 days  Lower extremity laceration and wound care - Deep laceration to the leg, initially sutured in the emergency room - Wound became infected, requiring reopening and debridement three days after initial closure - Wound described as deep, resembling a 'bear took a bite out of my leg' - Ongoing pain at the wound site - Requires dressing changes twice daily - Wound care appointment scheduled for April 27, 2024, and on a cancellation list for earlier availability  Rib fractures and pain management - Persistent significant pain from rib fractures - Sensation of a bone 'sliding in and out' with movement - Pain exacerbated by any movement or breathing - Currently taking gabapentin  and oxycodone  for pain control  Hypertension - Currently taking lisinopril  20 mg daily and hydrochlorothiazide  25 mg daily - Current regimen provides the best blood pressure control to date        04/05/2024    2:12 PM 03/07/2021    4:47 PM  Depression screen  PHQ 2/9  Decreased Interest 0 0  Down, Depressed, Hopeless 0 0  PHQ - 2 Score 0 0  Altered sleeping 0   Tired, decreased energy 0   Change in appetite 0   Feeling bad or failure about yourself  0   Trouble concentrating 0   Moving slowly or fidgety/restless 0   Suicidal thoughts 0   PHQ-9 Score 0   Difficult doing work/chores Not difficult at all        04/05/2024    2:12 PM  GAD 7 : Generalized Anxiety Score  Nervous, Anxious, on Edge 0  Control/stop worrying 0  Worry too much - different things 0  Trouble relaxing 0  Restless 0  Easily annoyed or irritable 0  Afraid - awful might happen 0  Total GAD 7 Score 0    Social History   Tobacco Use   Smoking status: Never   Smokeless tobacco: Never  Vaping Use   Vaping status: Never Used  Substance Use Topics   Alcohol use: Not Currently    Comment: No alcohol since 05/1987   Drug use: Not Currently    Types: Marijuana    Comment: age 35's    Review of Systems Per HPI unless specifically indicated above     Objective:    BP 132/84 (BP Location: Left Arm, Patient Position: Sitting, Cuff Size: Normal)   Pulse 88   Ht 6' 1 (1.854 m)   Wt 264 lb 8 oz (120 kg)   SpO2 93%   BMI  34.90 kg/m   Wt Readings from Last 3 Encounters:  04/05/24 264 lb 8 oz (120 kg)  06/03/22 238 lb 12.8 oz (108.3 kg)  05/07/21 250 lb (113.4 kg)    Physical Exam Vitals and nursing note reviewed.  Constitutional:      General: He is not in acute distress.    Appearance: Normal appearance. He is well-developed. He is not diaphoretic.     Comments: Well-appearing, comfortable, cooperative  HENT:     Head: Normocephalic and atraumatic.  Eyes:     General:        Right eye: No discharge.        Left eye: No discharge.     Conjunctiva/sclera: Conjunctivae normal.  Neck:     Thyroid: No thyromegaly.  Cardiovascular:     Rate and Rhythm: Normal rate and regular rhythm.     Pulses: Normal pulses.     Heart sounds: Normal heart sounds.  No murmur heard. Pulmonary:     Effort: Pulmonary effort is normal. No respiratory distress.     Breath sounds: Normal breath sounds. No wheezing or rales.     Comments: Reduced air movement due to pain on inspiration Chest:     Chest wall: Tenderness (over ribs) present.  Musculoskeletal:        General: Normal range of motion.     Cervical back: Normal range of motion and neck supple.  Lymphadenopathy:     Cervical: No cervical adenopathy.  Skin:    General: Skin is warm and dry.     Findings: Lesion (right lower extremity wound, see photo, dressing intact.) present. No erythema or rash.  Neurological:     Mental Status: He is alert and oriented to person, place, and time. Mental status is at baseline.  Psychiatric:        Mood and Affect: Mood normal.        Behavior: Behavior normal.        Thought Content: Thought content normal.     Comments: Well groomed, good eye contact, normal speech and thoughts    Photo is from day prior to the visit 7/29, taken by patient's wife on her phone.   Results for orders placed or performed during the hospital encounter of 03/27/21  I-STAT, chem 8   Collection Time: 03/27/21  1:07 PM  Result Value Ref Range   Sodium 142 135 - 145 mmol/L   Potassium 3.0 (L) 3.5 - 5.1 mmol/L   Chloride 103 98 - 111 mmol/L   BUN 9 6 - 20 mg/dL   Creatinine, Ser 8.99 0.61 - 1.24 mg/dL   Glucose, Bld 98 70 - 99 mg/dL   Calcium, Ion 8.87 (L) 1.15 - 1.40 mmol/L   TCO2 29 22 - 32 mmol/L   Hemoglobin 12.2 (L) 13.0 - 17.0 g/dL   HCT 63.9 (L) 60.9 - 47.9 %      Assessment & Plan:   Problem List Items Addressed This Visit     Essential hypertension - Primary   Relevant Medications   hydrochlorothiazide  (HYDRODIURIL ) 25 MG tablet   lisinopril  (ZESTRIL ) 20 MG tablet     Traumatic leg wound with delayed healing and infection 1 month ago traumatic fall. Large wound from fall, infected, debrided, under [facility] care. Healing prolonged, [facility]  referral scheduled. - Continue twice daily dressing changes. - Follow up with [facility] for wound checks. - Referral to [facility] for April 27, 2022, expedite if possible. Contacted referral coordinator to  attempt to expedite referral to urgent status if possible. Will work with her and [facility] surgery as needed to adjust order. - Already pending for MRI of the right shoulder, potential hip MRI later.  Multiple rib fractures with pain Multiple rib fractures from fall causing significant pain. Pain managed with gabapentin  and oxycodone . - Continue gabapentin  and oxycodone  for pain management. - Follow up with [facility] trauma team for ongoing assessment.  Traumatic pneumothorax Traumatic pneumothorax from fall, managed during hospital stay, under [facility] observation. - Follow up with [facility] trauma team for ongoing assessment.  Hypertension Hypertension well-controlled with lisinopril  and another medication. - Continue current antihypertensive medications (lisinopril  and another medication). - Prescribe 90-day supply with refill for both medications.  General Health Maintenance No physical in two years, requires routine health maintenance including prostate screening. - Schedule physical examination and blood panel. - Perform prostate screening via blood test.        No orders of the defined types were placed in this encounter.   Meds ordered this encounter  Medications   hydrochlorothiazide  (HYDRODIURIL ) 25 MG tablet    Sig: Take 1 tablet (25 mg total) by mouth daily.    Dispense:  90 tablet    Refill:  1   lisinopril  (ZESTRIL ) 20 MG tablet    Sig: Take 1 tablet (20 mg total) by mouth daily.    Dispense:  90 tablet    Refill:  1    Follow up plan: Return for 1-3 months fasting lab AM then 1 week later Annual Physical.  Future labs ordered  Marsa Officer, DO [facility] 04/05/2024, 2:25 PM

## 2024-04-06 ENCOUNTER — Other Ambulatory Visit: Payer: Self-pay | Admitting: Family Medicine

## 2024-04-06 DIAGNOSIS — I10 Essential (primary) hypertension: Secondary | ICD-10-CM

## 2024-04-06 DIAGNOSIS — Z13220 Encounter for screening for lipoid disorders: Secondary | ICD-10-CM

## 2024-04-06 DIAGNOSIS — R7309 Other abnormal glucose: Secondary | ICD-10-CM

## 2024-04-06 DIAGNOSIS — Z0000 Encounter for general adult medical examination without abnormal findings: Secondary | ICD-10-CM

## 2024-04-06 DIAGNOSIS — R351 Nocturia: Secondary | ICD-10-CM

## 2024-04-13 ENCOUNTER — Telehealth: Payer: Self-pay

## 2024-04-13 DIAGNOSIS — Z0279 Encounter for issue of other medical certificate: Secondary | ICD-10-CM

## 2024-04-13 NOTE — Telephone Encounter (Signed)
 Copied from CRM [id number]. Topic: General - Other >> Apr 13, 2024  1:36 PM Fonda T wrote: Reason for CRM: Received call from patient states spouse will be bringing forms by from employer to be completed.  Per patient please call when forms are completed and ready to be picked up.  Can be reached if need to be discussed further at [phone].

## 2024-04-13 NOTE — Telephone Encounter (Signed)
 We just received it today. They need to completed by tomorrow. I will try my best to get it done. I will likely have to call them to confirm information. We can contact them tomorrow when it is ready on Fri 8/8  Marsa Officer, DO [facility] 04/13/2024, 2:48 PM

## 2024-04-27 ENCOUNTER — Ambulatory Visit: Admitting: Physician Assistant

## 2024-06-01 ENCOUNTER — Other Ambulatory Visit

## 2024-06-01 DIAGNOSIS — I10 Essential (primary) hypertension: Secondary | ICD-10-CM

## 2024-06-01 DIAGNOSIS — R351 Nocturia: Secondary | ICD-10-CM

## 2024-06-01 DIAGNOSIS — Z13220 Encounter for screening for lipoid disorders: Secondary | ICD-10-CM

## 2024-06-01 DIAGNOSIS — R7309 Other abnormal glucose: Secondary | ICD-10-CM

## 2024-06-01 DIAGNOSIS — Z0000 Encounter for general adult medical examination without abnormal findings: Secondary | ICD-10-CM

## 2024-06-02 LAB — HEMOGLOBIN A1C
Hgb A1c MFr Bld: 5.7 % — ABNORMAL HIGH (ref <5.7)
Mean Plasma Glucose: 117 mg/dL
eAG (mmol/L): 6.5 mmol/L

## 2024-06-02 LAB — COMPREHENSIVE METABOLIC PANEL WITH GFR
AG Ratio: 1.2 (calc) (ref 1.0–2.5)
ALT: 13 U/L (ref 9–46)
AST: 18 U/L (ref 10–35)
Albumin: 4.1 g/dL (ref 3.6–5.1)
Alkaline phosphatase (APISO): 95 U/L (ref 35–144)
BUN: 17 mg/dL (ref 7–25)
CO2: 27 mmol/L (ref 20–32)
Calcium: 9.2 mg/dL (ref 8.6–10.3)
Chloride: 102 mmol/L (ref 98–110)
Creat: 1.03 mg/dL (ref 0.70–1.35)
Globulin: 3.5 g/dL (ref 1.9–3.7)
Glucose, Bld: 104 mg/dL — ABNORMAL HIGH (ref 65–99)
Potassium: 3.4 mmol/L — ABNORMAL LOW (ref 3.5–5.3)
Sodium: 139 mmol/L (ref 135–146)
Total Bilirubin: 0.3 mg/dL (ref 0.2–1.2)
Total Protein: 7.6 g/dL (ref 6.1–8.1)
eGFR: 83 mL/min/1.73m2 (ref >=60)

## 2024-06-02 LAB — LIPID PANEL
Cholesterol: 203 mg/dL — ABNORMAL HIGH (ref <200)
HDL: 31 mg/dL — ABNORMAL LOW (ref >=40)
LDL Cholesterol (Calc): 146 mg/dL — ABNORMAL HIGH
Non-HDL Cholesterol (Calc): 172 mg/dL — ABNORMAL HIGH (ref <130)
Total CHOL/HDL Ratio: 6.5 (calc) — ABNORMAL HIGH (ref <5.0)
Triglycerides: 134 mg/dL (ref <150)

## 2024-06-02 LAB — CBC WITH DIFFERENTIAL/PLATELET
Absolute Lymphocytes: 1643 {cells}/uL (ref 850–3900)
Absolute Monocytes: 567 {cells}/uL (ref 200–950)
Basophils Absolute: 58 {cells}/uL (ref 0–200)
Basophils Relative: 1.1 %
Eosinophils Absolute: 212 {cells}/uL (ref 15–500)
Eosinophils Relative: 4 %
HCT: 42.9 % (ref 38.5–50.0)
Hemoglobin: 13.7 g/dL (ref 13.2–17.1)
MCH: 26.4 pg — ABNORMAL LOW (ref 27.0–33.0)
MCHC: 31.9 g/dL — ABNORMAL LOW (ref 32.0–36.0)
MCV: 82.8 fL (ref 80.0–100.0)
MPV: 8.8 fL (ref 7.5–12.5)
Monocytes Relative: 10.7 %
Neutro Abs: 2820 {cells}/uL (ref 1500–7800)
Neutrophils Relative %: 53.2 %
Platelets: 247 Thousand/uL (ref 140–400)
RBC: 5.18 Million/uL (ref 4.20–5.80)
RDW: 14.5 % (ref 11.0–15.0)
Total Lymphocyte: 31 %
WBC: 5.3 Thousand/uL (ref 3.8–10.8)

## 2024-06-02 LAB — PSA: PSA: 1.58 ng/mL (ref <=4.00)

## 2024-06-08 ENCOUNTER — Other Ambulatory Visit: Payer: Self-pay | Admitting: Family Medicine

## 2024-06-08 ENCOUNTER — Ambulatory Visit (INDEPENDENT_AMBULATORY_CARE_PROVIDER_SITE_OTHER): Admitting: Family Medicine

## 2024-06-08 ENCOUNTER — Encounter: Payer: Self-pay | Admitting: Family Medicine

## 2024-06-08 VITALS — BP 126/84 | HR 64 | Ht 73.0 in | Wt 266.5 lb

## 2024-06-08 DIAGNOSIS — Z125 Encounter for screening for malignant neoplasm of prostate: Secondary | ICD-10-CM

## 2024-06-08 DIAGNOSIS — E7800 Pure hypercholesterolemia, unspecified: Secondary | ICD-10-CM | POA: Insufficient documentation

## 2024-06-08 DIAGNOSIS — R7309 Other abnormal glucose: Secondary | ICD-10-CM

## 2024-06-08 DIAGNOSIS — Z0000 Encounter for general adult medical examination without abnormal findings: Secondary | ICD-10-CM | POA: Diagnosis not present

## 2024-06-08 DIAGNOSIS — I10 Essential (primary) hypertension: Secondary | ICD-10-CM

## 2024-06-08 DIAGNOSIS — I7121 Aneurysm of the ascending aorta, without rupture: Secondary | ICD-10-CM | POA: Insufficient documentation

## 2024-06-08 DIAGNOSIS — I2510 Atherosclerotic heart disease of native coronary artery without angina pectoris: Secondary | ICD-10-CM | POA: Insufficient documentation

## 2024-06-08 DIAGNOSIS — Z87828 Personal history of other (healed) physical injury and trauma: Secondary | ICD-10-CM

## 2024-06-08 DIAGNOSIS — Z1211 Encounter for screening for malignant neoplasm of colon: Secondary | ICD-10-CM

## 2024-06-08 DIAGNOSIS — R351 Nocturia: Secondary | ICD-10-CM

## 2024-06-08 MED ORDER — LISINOPRIL 20 MG PO TABS: 20.0000 mg | ORAL_TABLET | Freq: Every day | ORAL | 3 refills | Status: DC

## 2024-06-08 MED ORDER — HYDROCHLOROTHIAZIDE 25 MG PO TABS: 25.0000 mg | ORAL_TABLET | Freq: Every day | ORAL | 3 refills | Status: DC

## 2024-06-08 NOTE — Progress Notes (Signed)
 Subjective:    Patient ID: Daniel Steele, male    DOB: 1961/08/05, 63 y.o.   MRN: [id number]  SHANTI EICHEL is a 63 y.o. male presenting on 06/08/2024 for Annual Exam   HPI  Discussed the use of AI scribe software for clinical note transcription with the patient, who gave verbal consent to proceed.  History of Present Illness   Daniel Steele is a 63 year old male who presents for follow-up on trauma injuries and blood pressure management.  Trauma-related musculoskeletal pain and healing Followed by [facility] - Sustained multiple trauma injuries, including rib fractures and a lower leg wound - Muscle tightness present around the site of rib fracture, particularly along the lateral aspect of the ribs - Anticipates proper healing by the end of the year  Pre-Diabetes A1c 5.7, elevated result Goal to improve diet Not on medication  CHRONIC HTN: - Blood pressure this morning was 126/84 mmHg - Currently taking lisinopril  20 mg daily and hydrochlorothiazide  25 mg daily - No issues with blood pressure control reported Reports good compliance, took meds today. Tolerating well, w/o complaints. Denies CP, dyspnea, HA, edema, dizziness / lightheadedness  Coronary Calcifications HYPERLIPIDEMIA:  Elevated LDL 140s on lab Identified coronary calcifications on CT Not on statin  Ascending Thoracic Aorta Dilation 4.8 cm on imaging CT Due for repeat in future for surveillance.   Health Maintenance:  Declines all vaccines  No prior Colorectal cancer screen. Order Cologuard  PSA 1.58     06/08/2024    8:15 AM 04/05/2024    2:12 PM 03/07/2021    4:47 PM  Depression screen PHQ 2/9  Decreased Interest 0 0 0  Down, Depressed, Hopeless 0 0 0  PHQ - 2 Score 0 0 0  Altered sleeping  0   Tired, decreased energy  0   Change in appetite  0   Feeling bad or failure about yourself   0   Trouble concentrating  0   Moving slowly or fidgety/restless  0    Suicidal thoughts  0   PHQ-9 Score  0   Difficult doing work/chores  Not difficult at all        06/08/2024    8:16 AM 04/05/2024    2:12 PM  GAD 7 : Generalized Anxiety Score  Nervous, Anxious, on Edge 0 0  Control/stop worrying 0 0  Worry too much - different things 0 0  Trouble relaxing 0 0  Restless 0 0  Easily annoyed or irritable 0 0  Afraid - awful might happen 0 0  Total GAD 7 Score 0 0     Past Medical History:  Diagnosis Date   Family history of adverse reaction to anesthesia    Family history of malignant hyperthermia    father and cousin   Hypertension    Malignant hyperthermia    Perianal abscess    Past Surgical History:  Procedure Laterality Date   ARTERY REPAIR Left 1974   left arm artery laceration from accidental window breakage   CHOLECYSTECTOMY  2011   INCISION AND DRAINAGE ABSCESS N/A 01/13/2021   Procedure: INCISION AND DRAINAGE ABSCESS perirectal;  Surgeon: Desiderio Schanz, MD;  Location: [facility] ORS;  Service: General;  Laterality: N/A;   INCISION AND DRAINAGE ABSCESS N/A 03/27/2021   Procedure: INCISION AND DRAINAGE ABSCESS;  Surgeon: Desiderio Schanz, MD;  Location: [facility] ORS;  Service: General;  Laterality: N/A;   Social History   Socioeconomic History   Marital status:  Married    Spouse name: Ginger   Number of children: 2   Years of education: Not on file   Highest education level: 12th grade  Occupational History   Not on file  Tobacco Use   Smoking status: Never   Smokeless tobacco: Never  Vaping Use   Vaping status: Never Used  Substance and Sexual Activity   Alcohol use: Not Currently    Comment: No alcohol since 05/1987   Drug use: Not Currently    Types: Marijuana    Comment: age 64's   Sexual activity: Not on file  Other Topics Concern   Not on file  Social History Narrative   Not on file   Social Drivers of Health   Financial Resource Strain: Low Risk  (06/04/2024)   Overall Financial Resource Strain (CARDIA)     Difficulty of Paying Living Expenses: Not very hard  Food Insecurity: No Food Insecurity (06/04/2024)   Hunger Vital Sign    Worried About Running Out of Food in the Last Year: Never true    Ran Out of Food in the Last Year: Never true  Transportation Needs: No Transportation Needs (06/04/2024)   PRAPARE - Administrator, Civil Service (Medical): No    Lack of Transportation (Non-Medical): No  Physical Activity: Insufficiently Active (06/04/2024)   Exercise Vital Sign    Days of Exercise per Week: 4 days    Minutes of Exercise per Session: 30 min  Stress: No Stress Concern Present (06/04/2024)   [employer] of Occupational Health - Occupational Stress Questionnaire    Feeling of Stress: Only a little  Social Connections: Socially Integrated (06/04/2024)   Social Connection and Isolation Panel    Frequency of Communication with Friends and Family: More than three times a week    Frequency of Social Gatherings with Friends and Family: Once a week    Attends Religious Services: More than 4 times per year    Active Member of [employer] or Organizations: Yes    Attends Engineer, structural: More than 4 times per year    Marital Status: Married  Catering manager Violence: Not on file   Family History  Problem Relation Age of Onset   Ovarian cancer Mother    Cancer Father    Brain cancer Father    Depression Father    Current Outpatient Medications on File Prior to Visit  Medication Sig   acetaminophen  (TYLENOL ) 500 MG tablet Take 2 tablets (1,000 mg total) by mouth every 6 (six) hours as needed for mild pain.   ibuprofen  (ADVIL ) 800 MG tablet Take 1 tablet (800 mg total) by mouth every 8 (eight) hours as needed for moderate pain.   No current facility-administered medications on file prior to visit.    Review of Systems  Constitutional:  Negative for activity change, appetite change, chills, diaphoresis, fatigue and fever.  HENT:  Negative for congestion and  hearing loss.   Eyes:  Negative for visual disturbance.  Respiratory:  Negative for cough, chest tightness, shortness of breath and wheezing.   Cardiovascular:  Negative for chest pain, palpitations and leg swelling.  Gastrointestinal:  Negative for abdominal pain, constipation, diarrhea, nausea and vomiting.  Genitourinary:  Negative for dysuria, frequency and hematuria.  Musculoskeletal:  Negative for arthralgias and neck pain.  Skin:  Negative for rash.  Neurological:  Negative for dizziness, weakness, light-headedness, numbness and headaches.  Hematological:  Negative for adenopathy.  Psychiatric/Behavioral:  Negative for behavioral problems, dysphoric mood  and sleep disturbance.    Per HPI unless specifically indicated above     Objective:    BP 126/84 (BP Location: Right Arm, Patient Position: Sitting, Cuff Size: Large)   Pulse 64   Ht 6' 1 (1.854 m)   Wt 266 lb 8 oz (120.9 kg)   SpO2 97%   BMI 35.16 kg/m   Wt Readings from Last 3 Encounters:  06/08/24 266 lb 8 oz (120.9 kg)  04/05/24 264 lb 8 oz (120 kg)  06/03/22 238 lb 12.8 oz (108.3 kg)    Physical Exam Vitals and nursing note reviewed.  Constitutional:      General: He is not in acute distress.    Appearance: He is well-developed. He is not diaphoretic.     Comments: Well-appearing, comfortable, cooperative  HENT:     Head: Normocephalic and atraumatic.  Eyes:     General:        Right eye: No discharge.        Left eye: No discharge.     Conjunctiva/sclera: Conjunctivae normal.     Pupils: Pupils are equal, round, and reactive to light.  Neck:     Thyroid: No thyromegaly.     Vascular: No carotid bruit.  Cardiovascular:     Rate and Rhythm: Normal rate and regular rhythm.     Pulses: Normal pulses.     Heart sounds: Normal heart sounds. No murmur heard. Pulmonary:     Effort: Pulmonary effort is normal. No respiratory distress.     Breath sounds: Normal breath sounds. No wheezing or rales.  Abdominal:      General: Bowel sounds are normal. There is no distension.     Palpations: Abdomen is soft. There is no mass.     Tenderness: There is no abdominal tenderness.  Musculoskeletal:        General: No tenderness. Normal range of motion.     Cervical back: Normal range of motion and neck supple.     Right lower leg: No edema.     Left lower leg: No edema.     Comments: Upper / Lower Extremities: - Normal muscle tone, strength bilateral upper extremities 5/5, lower extremities 5/5  Lymphadenopathy:     Cervical: No cervical adenopathy.  Skin:    General: Skin is warm and dry.     Findings: No erythema or rash.  Neurological:     Mental Status: He is alert and oriented to person, place, and time.     Comments: Distal sensation intact to light touch all extremities  Psychiatric:        Mood and Affect: Mood normal.        Behavior: Behavior normal.        Thought Content: Thought content normal.     Comments: Well groomed, good eye contact, normal speech and thoughts     Results for orders placed or performed in visit on 06/01/24  Comprehensive metabolic panel with GFR   Collection Time: 06/01/24  7:59 AM  Result Value Ref Range   Glucose, Bld 104 (H) 65 - 99 mg/dL   BUN 17 7 - 25 mg/dL   Creat 8.96 9.29 - 8.64 mg/dL   eGFR 83 > OR = 60 fO/fpw/8.26f7   BUN/Creatinine Ratio SEE NOTE: 6 - 22 (calc)   Sodium 139 135 - 146 mmol/L   Potassium 3.4 (L) 3.5 - 5.3 mmol/L   Chloride 102 98 - 110 mmol/L   CO2 27 20 - 32 mmol/L  Calcium 9.2 8.6 - 10.3 mg/dL   Total Protein 7.6 6.1 - 8.1 g/dL   Albumin 4.1 3.6 - 5.1 g/dL   Globulin 3.5 1.9 - 3.7 g/dL (calc)   AG Ratio 1.2 1.0 - 2.5 (calc)   Total Bilirubin 0.3 0.2 - 1.2 mg/dL   Alkaline phosphatase (APISO) 95 35 - 144 U/L   AST 18 10 - 35 U/L   ALT 13 9 - 46 U/L  PSA   Collection Time: 06/01/24  7:59 AM  Result Value Ref Range   PSA 1.58 < OR = 4.00 ng/mL  CBC with Differential/Platelet   Collection Time: 06/01/24  7:59 AM   Result Value Ref Range   WBC 5.3 3.8 - 10.8 Thousand/uL   RBC 5.18 4.20 - 5.80 Million/uL   Hemoglobin 13.7 13.2 - 17.1 g/dL   HCT 57.0 61.4 - 49.9 %   MCV 82.8 80.0 - 100.0 fL   MCH 26.4 (L) 27.0 - 33.0 pg   MCHC 31.9 (L) 32.0 - 36.0 g/dL   RDW 85.4 88.9 - 84.9 %   Platelets 247 140 - 400 Thousand/uL   MPV 8.8 7.5 - 12.5 fL   Neutro Abs 2,820 1,500 - 7,800 cells/uL   Absolute Lymphocytes 1,643 850 - 3,900 cells/uL   Absolute Monocytes 567 200 - 950 cells/uL   Eosinophils Absolute 212 15 - 500 cells/uL   Basophils Absolute 58 0 - 200 cells/uL   Neutrophils Relative % 53.2 %   Total Lymphocyte 31.0 %   Monocytes Relative 10.7 %   Eosinophils Relative 4.0 %   Basophils Relative 1.1 %  Hemoglobin A1c   Collection Time: 06/01/24  7:59 AM  Result Value Ref Range   Hgb A1c MFr Bld 5.7 (H) <5.7 %   Mean Plasma Glucose 117 mg/dL   eAG (mmol/L) 6.5 mmol/L  Lipid panel   Collection Time: 06/01/24  7:59 AM  Result Value Ref Range   Cholesterol 203 (H) <200 mg/dL   HDL 31 (L) > OR = 40 mg/dL   Triglycerides 865 <849 mg/dL   LDL Cholesterol (Calc) 146 (H) mg/dL (calc)   Total CHOL/HDL Ratio 6.5 (H) <5.0 (calc)   Non-HDL Cholesterol (Calc) 172 (H) <130 mg/dL (calc)      Assessment & Plan:   Problem List Items Addressed This Visit     Aneurysm of ascending aorta without rupture   Relevant Medications   hydrochlorothiazide  (HYDRODIURIL ) 25 MG tablet   lisinopril  (ZESTRIL ) 20 MG tablet   Coronary artery calcification   Relevant Medications   hydrochlorothiazide  (HYDRODIURIL ) 25 MG tablet   lisinopril  (ZESTRIL ) 20 MG tablet   Essential hypertension   Relevant Medications   hydrochlorothiazide  (HYDRODIURIL ) 25 MG tablet   lisinopril  (ZESTRIL ) 20 MG tablet   Other Visit Diagnoses       Annual physical exam    -  Primary     Colon cancer screening       Relevant Orders   Cologuard     History of traumatic injury            Updated Health Maintenance  information Reviewed recent lab results with patient Encouraged improvement to lifestyle with diet and exercise Goal of weight loss  History of multiple traumatic injuries Rib fractures and healing rib injuries Followed by [facility] Rib fractures and cracked ribs healing without complications. Muscle tightness likely due to tension around healing ribs. Right lower extremity wound healing. - Continue current management and allow time for healing. -  Encourage walking on treadmill to improve lung expansion.  Extensive coronary artery calcification Identified on CT imaging incidental finding. Extensive coronary artery calcification with potential heart attack risk. Informed about importance of assessing calcifications. - Recommended referral to cardiologist for further evaluation vs CT Calcium Score to quantify calficiation. He declines today after our discussion, he can let us  know if interested in pursuing further work up  Ascending thoracic aortic dilation (4.8 cm) Ascending thoracic aortic dilation at 4.8 cm, requires monitoring. - Monitor aortic dilation and reassess if it reaches 5.0 cm. - Upcoming repeat scan planned per specialist  Hypertension Blood pressure well-controlled at 126/84 mmHg. - Continue lisinopril  20 mg daily. - Continue hydrochlorothiazide  25 mg daily. - Ensure medication supply for travel.  Hyperlipidemia LDL cholesterol elevated at 146 mg/dL, above target of 899 mg/dL. Discussed heart attack and stroke risk, 14.8% over ten years. Patient prefers to avoid additional medications. - Recommend heart-healthy diet and lifestyle modifications. - Recommend statin therapy, he declines at this time  Prediabetes A1c at 5.7%, indicating borderline prediabetes. - Recommend low-carb, low-sugar diet.  General Health Maintenance Discussed pneumonia vaccination and colon cancer screening with Cologuard. Patient hesitant about vaccinations but agreed to  Cologuard. - Consider pneumonia vaccination in future. - Order Cologuard for colon cancer screening.         Orders Placed This Encounter  Procedures   Cologuard    Meds ordered this encounter  Medications   hydrochlorothiazide  (HYDRODIURIL ) 25 MG tablet    Sig: Take 1 tablet (25 mg total) by mouth daily.    Dispense:  90 tablet    Refill:  3   lisinopril  (ZESTRIL ) 20 MG tablet    Sig: Take 1 tablet (20 mg total) by mouth daily.    Dispense:  90 tablet    Refill:  3     Follow up plan: Return for 1 year fasting lab > 1 week later Annual Physical.  Future labs 06/08/25  Marsa Officer, DO [facility] 06/08/2024, 8:42 AM

## 2024-06-08 NOTE — Patient Instructions (Addendum)
 Thank you for coming to the office today.  Recommend Rx Statin Cholesterol medication for reducing cardiovascular risk in future.  Recommend Prevnar-20 pneumonia vaccine in future  You have already had CT Scan of Chest - including the heart. But a Cardiologist did not study your heart images, I can offer another test for $99 that can show more detail. OR we can just skip the test, and talk to the Cardiologist.  Coronary Calcium Score Cardiac CT Scan. This is a screening test for patients aged 34-50+ with cardiovascular risk factors or who are healthy but would be interested in Cardiovascular Screening for heart disease. Even if there is a family history of heart disease, this imaging can be useful. Typically it can be done every 5+ years or at a different timeline we agree on  The scan will look at the chest and mainly focus on the heart and identify early signs of calcium build up or blockages within the heart arteries. It is not 100% accurate for identifying blockages or heart disease, but it is useful to help us  predict who may have some early changes or be at risk in the future for a heart attack or cardiovascular problem.  The results are reviewed by a Cardiologist and they will document the results. It should become available on MyChart. Typically the results are divided into percentiles based on other patients of the same demographic and age. So it will compare your risk to others similar to you. If you have a higher score >99 or higher percentile >75%tile, it is recommended to consider Statin cholesterol therapy and or referral to Cardiologist. I will try to help explain your results and if we have questions we can contact the Cardiologist.  You will be contacted for scheduling. Usually it is done at any imaging facility through [facility], [facility] or [facility].  The cost is $99 flat fee total and it does not go through insurance, so no authorization is  required.    Colon Cancer Screening: Ordered the Cologuard (home kit) test for colon cancer screening. Stay tuned for further updates.  It will be shipped to you directly. If not received in 2-4 weeks, call us  or the company.   If you send it back and no results are received in 2-4 weeks, call us  or the company as well!   Colon Cancer Screening: - For all adults age 62+ routine colon cancer screening is highly recommended.     - Recent guidelines from American Cancer Society recommend starting age of 26 - Early detection of colon cancer is important, because often there are no warning signs or symptoms, also if found early usually it can be cured. Late stage is hard to treat.   - If Cologuard is NEGATIVE, then it is good for 3 years before next due - If Cologuard is POSITIVE, then it is strongly advised to get a Colonoscopy, which allows the GI doctor to locate the source of the cancer or polyp (even very early stage) and treat it by removing it. ------------------------- Follow instructions to collect sample, you may call the company for any help or questions, 24/7 telephone support at [phone].   Please schedule a Follow-up Appointment to: Return for 1 year fasting lab > 1 week later Annual Physical.  If you have any other questions or concerns, please feel free to call the office or send a message through MyChart. You may also schedule an earlier appointment if necessary.  Additionally, you may  be receiving a survey about your experience at our office within a few days to 1 week by e-mail or mail. We value your feedback.  Marsa Officer, DO [facility], [place]

## 2024-08-22 ENCOUNTER — Ambulatory Visit: Payer: Self-pay | Admitting: Family Medicine

## 2024-08-22 LAB — COLOGUARD: COLOGUARD: NEGATIVE

## 2024-08-23 ENCOUNTER — Telehealth: Payer: Self-pay

## 2024-08-23 NOTE — Telephone Encounter (Signed)
 Copied from CRM #[id number]. Topic: Appointments - Scheduling Inquiry for Clinic >> Aug 23, 2024 10:45 AM Travis F wrote: Reason for CRM: Patient's wife is calling in because patient was put on some blood pressure medications and was told by Dr. MARLA to call in if he was still having issues with his blood pressure. Attempted to schedule an appointment and first available was 09/06/24. They don't want to wait that long because they're concerned. Patient and his wife wants to know if there another day sooner patient can be worked in or seen. Please follow up with patient's wife.

## 2024-08-23 NOTE — Telephone Encounter (Signed)
 I reviewed his chart and it looks like the last 2 readings of BP have been controlled on current regimen. He is on Lisinopril  20mg  daily and hydrochlorothiazide  25mg  daily. It is okay to see Dr JENEANE sooner if he needs a BP check and review of his BP meds. Based on current information that is reasonable.  Marsa Officer, DO [facility] 08/23/2024, 1:45 PM

## 2024-08-25 ENCOUNTER — Ambulatory Visit

## 2024-08-25 VITALS — BP 148/106 | HR 92 | Ht 73.0 in | Wt 263.8 lb

## 2024-08-25 DIAGNOSIS — E876 Hypokalemia: Secondary | ICD-10-CM | POA: Diagnosis not present

## 2024-08-25 DIAGNOSIS — I10 Essential (primary) hypertension: Secondary | ICD-10-CM

## 2024-08-25 MED ORDER — CHLORTHALIDONE 25 MG PO TABS: 25.0000 mg | ORAL_TABLET | Freq: Every day | ORAL | 1 refills | Status: AC

## 2024-08-25 MED ORDER — LISINOPRIL 20 MG PO TABS: 40.0000 mg | ORAL_TABLET | Freq: Every day | ORAL | 3 refills | Status: AC

## 2024-08-25 NOTE — Progress Notes (Signed)
" ° ° ° °  Acute Patient Visit  Physician: Shantara Goosby A Juelle Dickmann, MD  Patient: Daniel Steele MRN: [id number] DOB: 1960-11-07 PCP: Edman Marsa PARAS, DO     Subjective:   Chief Complaint  Patient presents with   Hypertension    Patient concerned about BP     HPI:  Discussed the use of AI scribe software for clinical note transcription with the patient, who gave verbal consent to proceed.  History of Present Illness   Daniel Steele is a 63 year old male with hypertension who presents with elevated blood pressure readings.  Elevated blood pressure - Blood pressure readings range from 155/105 to 180/105 mmHg - On hydrochlorothiazide  25 mg and lisinopril  20 mg daily since July - Initial improvement in blood pressure after starting medications, but recent increase in readings - Home blood pressure monitoring shows inconsistent control - No dizziness or lightheadedness  Laboratory findings - Last laboratory tests in September showed normal kidney function - Tendency toward low potassium levels  ROS:   As noted in the HPI    ASSESMENT/PLAN:  Encounter Diagnoses  Name Primary?   Hypokalemia Yes   Essential hypertension     No orders of the defined types were placed in this encounter.   Assessment and Plan    Hypertension Uncontrolled on current regimen. Home readings 155-180/105 mmHg. Office reading 148/106 mmHg. Plan to switch to chlorthalidone  for better efficacy. - Discontinued hydrochlorothiazide . - Instructed to monitor blood pressure at home and maintain a log. - If blood pressure remains elevated, increase lisinopril  to 40 mg daily. - Scheduled follow-up with PCP in January.  Hypokalemia Potassium levels mild persistent low, requires monitoring. - Suggest labs in January to monitor kidney function and potassium levels. He can discuss with PCP planning for this  F/u otherwise as needed           OBJECTIVE: Vitals:   08/25/24 0945  BP:  (!) 148/106  Pulse: 92  SpO2: 95%  Weight: 263 lb 12.8 oz (119.7 kg)  Height: 6' 1 (1.854 m)    Body mass index is 34.8 kg/m.   Physical Exam Vitals reviewed.  Constitutional:      Appearance: Normal appearance. Well-developed with normal weight.  Cardiovascular:     Rate and Rhythm: Normal rate and regular rhythm. Normal heart sounds. Normal peripheral pulses Pulmonary:     Normal breath sounds with normal effort Skin:    General: Skin is warm and dry without noticeable rash. Neurological:     General: No focal deficit present.  Psychiatric:        Mood and Affect: Mood, behavior and cognition normal       Allergies Patient has no known allergies.  Past Medical History Patient  has a past medical history of Family history of adverse reaction to anesthesia, Family history of malignant hyperthermia, Hypertension, Malignant hyperthermia, and Perianal abscess.  Surgical History Patient  has a past surgical history that includes Cholecystectomy (2011); Incision and drainage abscess (N/A, 01/13/2021); Artery repair (Left, 1974); and Incision and drainage abscess (N/A, 03/27/2021).  Family History Pateint's family history includes Brain cancer in his father; Cancer in his father; Depression in his father; Ovarian cancer in his mother.  Social History Patient  reports that he has never smoked. He has never used smokeless tobacco. He reports that he does not currently use alcohol. He reports that he does not currently use drugs after having used the following drugs: Marijuana.    08/25/2024 "

## 2024-10-02 ENCOUNTER — Ambulatory Visit: Admitting: Family Medicine

## 2025-06-08 ENCOUNTER — Other Ambulatory Visit

## 2025-06-15 ENCOUNTER — Encounter: Admitting: Family Medicine
